# Patient Record
Sex: Male | Born: 1940 | Race: White | Hispanic: No | Marital: Married | Smoking: Former smoker
Health system: Southern US, Community
[De-identification: ages and names within clinical notes are randomized; demographics above are authoritative.]

## PROBLEM LIST (undated history)

## (undated) DIAGNOSIS — M1990 Unspecified osteoarthritis, unspecified site: Secondary | ICD-10-CM

## (undated) DIAGNOSIS — J449 Chronic obstructive pulmonary disease, unspecified: Secondary | ICD-10-CM

## (undated) DIAGNOSIS — Z87442 Personal history of urinary calculi: Secondary | ICD-10-CM

## (undated) HISTORY — PX: CATARACT EXTRACTION W/ INTRAOCULAR LENS IMPLANT: SHX1309

## (undated) HISTORY — PX: CORNEAL TRANSPLANT: SHX108

## (undated) HISTORY — PX: TONSILLECTOMY: SUR1361

---

## 2014-10-10 ENCOUNTER — Other Ambulatory Visit: Payer: Self-pay | Admitting: Orthopedic Surgery

## 2014-10-11 ENCOUNTER — Other Ambulatory Visit: Payer: Self-pay | Admitting: Orthopedic Surgery

## 2014-10-11 DIAGNOSIS — Z01818 Encounter for other preprocedural examination: Secondary | ICD-10-CM

## 2014-10-11 DIAGNOSIS — M25511 Pain in right shoulder: Secondary | ICD-10-CM

## 2014-10-15 ENCOUNTER — Ambulatory Visit
Admission: RE | Admit: 2014-10-15 | Discharge: 2014-10-15 | Disposition: A | Discharge status: Home / Self Care | Payer: Medicare Other | Source: Ambulatory Visit | Attending: Orthopedic Surgery | Admitting: Orthopedic Surgery

## 2014-10-15 DIAGNOSIS — M25511 Pain in right shoulder: Secondary | ICD-10-CM

## 2014-10-15 DIAGNOSIS — Z01818 Encounter for other preprocedural examination: Secondary | ICD-10-CM

## 2014-10-19 ENCOUNTER — Ambulatory Visit (HOSPITAL_COMMUNITY)
Admission: RE | Admit: 2014-10-19 | Discharge: 2014-10-19 | Disposition: A | Discharge status: Home / Self Care | Payer: Medicare Other | Source: Ambulatory Visit | Attending: Orthopedic Surgery | Admitting: Orthopedic Surgery

## 2014-10-19 ENCOUNTER — Encounter (HOSPITAL_COMMUNITY)
Admission: RE | Admit: 2014-10-19 | Discharge: 2014-10-19 | Disposition: A | Discharge status: Home / Self Care | Payer: Medicare Other | Source: Ambulatory Visit | Attending: Orthopedic Surgery | Admitting: Orthopedic Surgery

## 2014-10-19 ENCOUNTER — Encounter (HOSPITAL_COMMUNITY): Payer: Self-pay

## 2014-10-19 DIAGNOSIS — Z01818 Encounter for other preprocedural examination: Secondary | ICD-10-CM

## 2014-10-19 HISTORY — DX: Chronic obstructive pulmonary disease, unspecified: J44.9

## 2014-10-19 HISTORY — DX: Personal history of urinary calculi: Z87.442

## 2014-10-19 HISTORY — DX: Unspecified osteoarthritis, unspecified site: M19.90

## 2014-10-19 LAB — CBC WITH DIFFERENTIAL/PLATELET
BASOS ABS: 0.1 10*3/uL (ref 0.0–0.1)
BASOS PCT: 1 % (ref 0–1)
Eosinophils Absolute: 0.2 10*3/uL (ref 0.0–0.7)
Eosinophils Relative: 3 % (ref 0–5)
HCT: 41.3 % (ref 39.0–52.0)
Hemoglobin: 14.4 g/dL (ref 13.0–17.0)
LYMPHS ABS: 2.2 10*3/uL (ref 0.7–4.0)
Lymphocytes Relative: 30 % (ref 12–46)
MCH: 33.9 pg (ref 26.0–34.0)
MCHC: 34.9 g/dL (ref 30.0–36.0)
MCV: 97.2 fL (ref 78.0–100.0)
MONO ABS: 1 10*3/uL (ref 0.1–1.0)
MONOS PCT: 13 % — AB (ref 3–12)
Neutro Abs: 3.9 10*3/uL (ref 1.7–7.7)
Neutrophils Relative %: 53 % (ref 43–77)
Platelets: 231 10*3/uL (ref 150–400)
RBC: 4.25 MIL/uL (ref 4.22–5.81)
RDW: 12.7 % (ref 11.5–15.5)
WBC: 7.4 10*3/uL (ref 4.0–10.5)

## 2014-10-19 LAB — COMPREHENSIVE METABOLIC PANEL
ALBUMIN: 4.4 g/dL (ref 3.5–5.2)
ALT: 17 U/L (ref 0–53)
ANION GAP: 10 (ref 5–15)
AST: 29 U/L (ref 0–37)
Alkaline Phosphatase: 63 U/L (ref 39–117)
BILIRUBIN TOTAL: 0.6 mg/dL (ref 0.3–1.2)
BUN: 19 mg/dL (ref 6–23)
CO2: 27 mmol/L (ref 19–32)
Calcium: 9.5 mg/dL (ref 8.4–10.5)
Chloride: 103 mEq/L (ref 96–112)
Creatinine, Ser: 0.99 mg/dL (ref 0.50–1.35)
GFR calc Af Amer: >90 mL/min (ref >90)
GFR, EST NON AFRICAN AMERICAN: 78 mL/min — AB (ref >90)
Glucose, Bld: 127 mg/dL — ABNORMAL HIGH (ref 70–99)
Potassium: 4 mmol/L (ref 3.5–5.1)
Sodium: 140 mmol/L (ref 135–145)
Total Protein: 7.1 g/dL (ref 6.0–8.3)

## 2014-10-19 LAB — URINALYSIS, ROUTINE W REFLEX MICROSCOPIC
BILIRUBIN URINE: NEGATIVE
Glucose, UA: NEGATIVE mg/dL
HGB URINE DIPSTICK: NEGATIVE
Ketones, ur: NEGATIVE mg/dL
LEUKOCYTES UA: NEGATIVE
Nitrite: NEGATIVE
PH: 5.5 (ref 5.0–8.0)
Protein, ur: NEGATIVE mg/dL
SPECIFIC GRAVITY, URINE: 1.024 (ref 1.005–1.030)
Urobilinogen, UA: 0.2 mg/dL (ref 0.0–1.0)

## 2014-10-19 LAB — SURGICAL PCR SCREEN
MRSA, PCR: NEGATIVE
Staphylococcus aureus: NEGATIVE

## 2014-10-19 LAB — TYPE AND SCREEN
ABO/RH(D): O POS
ANTIBODY SCREEN: NEGATIVE

## 2014-10-19 LAB — ABO/RH: ABO/RH(D): O POS

## 2014-10-19 LAB — APTT: aPTT: 31 seconds (ref 24–37)

## 2014-10-19 LAB — PROTIME-INR
INR: 1.09 (ref 0.00–1.49)
Prothrombin Time: 14.3 seconds (ref 11.6–15.2)

## 2014-10-19 NOTE — Pre-Procedure Instructions (Signed)
Ferne CoeDavid Tuckey  10/19/2014   Your procedure is scheduled on:  Thursday  10/25/14  [facility] Admitting at 820 AM.  Call this number if you have problems the morning of surgery: [phone]   Remember:   Do not eat food or drink liquids after midnight.   Take these medicines the morning of surgery with A SIP OF WATER:  EYE DROPS (STOP ASPIRIN, IBUPROFEN, ADVIL, MOTRIN, FISH OIL, MULTIVITAMIN 7 DAYS PRIOR TO SURGERY)   Do not wear jewelry, make-up or nail polish.  Do not wear lotions, powders, or perfumes. You may wear deodorant.  Do not shave 48 hours prior to surgery. Men may shave face and neck.  Do not bring valuables to the hospital.  [facility] is not responsible                  for any belongings or valuables.               Contacts, dentures or bridgework may not be worn into surgery.  Leave suitcase in the car. After surgery it may be brought to your room.  For patients admitted to the hospital, discharge time is determined by your                treatment team.               Patients discharged the day of surgery will not be allowed to drive  home.  Name and phone number of your driver:   Special Instructions: Shower using CHG 2 nights before surgery and the night before surgery.  If you shower the day of surgery use CHG.  Use special wash - you have one bottle of CHG for all showers.  You should use approximately 1/3 of the bottle for each shower.   Please read over the following fact sheets that you were given: Pain Booklet, Coughing and Deep Breathing, MRSA Information and Surgical Site Infection Prevention

## 2014-10-24 MED ORDER — CEFAZOLIN SODIUM-DEXTROSE 2-3 GM-% IV SOLR
2.0000 g | INTRAVENOUS | Status: AC
  Administered 2014-10-25: 2 g via INTRAVENOUS
  Filled 2014-10-24: qty 50

## 2014-10-25 ENCOUNTER — Encounter (HOSPITAL_COMMUNITY): Payer: Self-pay | Admitting: Anesthesiology

## 2014-10-25 ENCOUNTER — Inpatient Hospital Stay (HOSPITAL_COMMUNITY): Payer: Medicare Other

## 2014-10-25 ENCOUNTER — Inpatient Hospital Stay (HOSPITAL_COMMUNITY): Payer: Medicare Other | Admitting: Anesthesiology

## 2014-10-25 ENCOUNTER — Inpatient Hospital Stay (HOSPITAL_COMMUNITY)
Admission: RE | Admit: 2014-10-25 | Discharge: 2014-10-26 | DRG: 483 | Disposition: A | Discharge status: Home / Self Care | Payer: Medicare Other | Source: Ambulatory Visit | Attending: Orthopedic Surgery | Admitting: Orthopedic Surgery

## 2014-10-25 ENCOUNTER — Encounter (HOSPITAL_COMMUNITY)
Admission: RE | Disposition: A | Discharge status: Home / Self Care | Payer: Self-pay | Source: Ambulatory Visit | Attending: Orthopedic Surgery

## 2014-10-25 DIAGNOSIS — M19011 Primary osteoarthritis, right shoulder: Secondary | ICD-10-CM | POA: Diagnosis present

## 2014-10-25 DIAGNOSIS — Z87891 Personal history of nicotine dependence: Secondary | ICD-10-CM | POA: Diagnosis not present

## 2014-10-25 DIAGNOSIS — J449 Chronic obstructive pulmonary disease, unspecified: Secondary | ICD-10-CM | POA: Diagnosis present

## 2014-10-25 DIAGNOSIS — Z79899 Other long term (current) drug therapy: Secondary | ICD-10-CM

## 2014-10-25 DIAGNOSIS — Z7982 Long term (current) use of aspirin: Secondary | ICD-10-CM

## 2014-10-25 DIAGNOSIS — M25511 Pain in right shoulder: Secondary | ICD-10-CM | POA: Diagnosis present

## 2014-10-25 DIAGNOSIS — Z947 Corneal transplant status: Secondary | ICD-10-CM

## 2014-10-25 DIAGNOSIS — Z96611 Presence of right artificial shoulder joint: Secondary | ICD-10-CM

## 2014-10-25 DIAGNOSIS — Z96619 Presence of unspecified artificial shoulder joint: Secondary | ICD-10-CM

## 2014-10-25 HISTORY — PX: TOTAL SHOULDER ARTHROPLASTY: SHX126

## 2014-10-25 SURGERY — ARTHROPLASTY, SHOULDER, TOTAL
Anesthesia: General | Laterality: Right

## 2014-10-25 MED ORDER — ONDANSETRON HCL 4 MG/2ML IJ SOLN
INTRAMUSCULAR | Status: DC | PRN
  Administered 2014-10-25: 4 mg via INTRAVENOUS

## 2014-10-25 MED ORDER — ACETAMINOPHEN 325 MG PO TABS: 650.0000 mg | ORAL_TABLET | Freq: Four times a day (QID) | ORAL | Status: DC | PRN

## 2014-10-25 MED ORDER — FLEET ENEMA 7-19 GM/118ML RE ENEM: 1.0000 | ENEMA | Freq: Once | RECTAL | Status: AC | PRN

## 2014-10-25 MED ORDER — KETOROLAC TROMETHAMINE 30 MG/ML IJ SOLN
30.0000 mg | Freq: Once | INTRAMUSCULAR | Status: AC | PRN
  Administered 2014-10-25: 30 mg via INTRAVENOUS

## 2014-10-25 MED ORDER — PHENYLEPHRINE HCL 10 MG/ML IJ SOLN
INTRAMUSCULAR | Status: DC | PRN
  Administered 2014-10-25: 40 ug via INTRAVENOUS
  Administered 2014-10-25: 80 ug via INTRAVENOUS

## 2014-10-25 MED ORDER — OXYCODONE HCL 5 MG PO TABS
5.0000 mg | ORAL_TABLET | ORAL | Status: DC | PRN
  Administered 2014-10-26: 10 mg via ORAL
  Filled 2014-10-25: qty 2

## 2014-10-25 MED ORDER — PROPOFOL 10 MG/ML IV BOLUS
INTRAVENOUS | Status: DC | PRN
  Administered 2014-10-25: 150 mg via INTRAVENOUS
  Administered 2014-10-25: 50 mg via INTRAVENOUS

## 2014-10-25 MED ORDER — 0.9 % SODIUM CHLORIDE (POUR BTL) OPTIME
TOPICAL | Status: DC | PRN
  Administered 2014-10-25: 1000 mL

## 2014-10-25 MED ORDER — ROCURONIUM BROMIDE 50 MG/5ML IV SOLN
INTRAVENOUS | Status: AC
  Filled 2014-10-25: qty 1

## 2014-10-25 MED ORDER — KETOROLAC TROMETHAMINE 30 MG/ML IJ SOLN
INTRAMUSCULAR | Status: AC
  Administered 2014-10-25: 30 mg via INTRAVENOUS
  Filled 2014-10-25: qty 1

## 2014-10-25 MED ORDER — FENTANYL CITRATE 0.05 MG/ML IJ SOLN
INTRAMUSCULAR | Status: AC
  Administered 2014-10-25: 50 ug
  Filled 2014-10-25: qty 2

## 2014-10-25 MED ORDER — SIMVASTATIN 40 MG PO TABS
40.0000 mg | ORAL_TABLET | Freq: Every day | ORAL | Status: DC
  Administered 2014-10-25: 40 mg via ORAL
  Filled 2014-10-25 (×2): qty 1

## 2014-10-25 MED ORDER — FENTANYL CITRATE 0.05 MG/ML IJ SOLN
INTRAMUSCULAR | Status: AC
  Filled 2014-10-25: qty 5

## 2014-10-25 MED ORDER — PHENYLEPHRINE HCL 10 MG/ML IJ SOLN
10.0000 mg | INTRAVENOUS | Status: DC | PRN
  Administered 2014-10-25: 25 ug/min via INTRAVENOUS

## 2014-10-25 MED ORDER — ROCURONIUM BROMIDE 100 MG/10ML IV SOLN
INTRAVENOUS | Status: DC | PRN
  Administered 2014-10-25: 50 mg via INTRAVENOUS

## 2014-10-25 MED ORDER — ALUMINUM HYDROXIDE GEL 320 MG/5ML PO SUSP
15.0000 mL | ORAL | Status: DC | PRN
  Filled 2014-10-25: qty 30

## 2014-10-25 MED ORDER — PROMETHAZINE HCL 25 MG/ML IJ SOLN: 6.2500 mg | INTRAMUSCULAR | Status: DC | PRN

## 2014-10-25 MED ORDER — OXYCODONE-ACETAMINOPHEN 5-325 MG PO TABS
ORAL_TABLET | ORAL | Status: AC
  Administered 2014-10-25: 2 via ORAL
  Filled 2014-10-25: qty 2

## 2014-10-25 MED ORDER — MONTELUKAST SODIUM 10 MG PO TABS
10.0000 mg | ORAL_TABLET | Freq: Every day | ORAL | Status: DC
  Administered 2014-10-25: 10 mg via ORAL
  Filled 2014-10-25 (×2): qty 1

## 2014-10-25 MED ORDER — MORPHINE SULFATE 2 MG/ML IJ SOLN: 1.0000 mg | INTRAMUSCULAR | Status: DC | PRN

## 2014-10-25 MED ORDER — ONDANSETRON HCL 4 MG/2ML IJ SOLN: 4.0000 mg | Freq: Four times a day (QID) | INTRAMUSCULAR | Status: DC | PRN

## 2014-10-25 MED ORDER — ACETAMINOPHEN 650 MG RE SUPP: 650.0000 mg | Freq: Four times a day (QID) | RECTAL | Status: DC | PRN

## 2014-10-25 MED ORDER — GLYCOPYRROLATE 0.2 MG/ML IJ SOLN
INTRAMUSCULAR | Status: DC | PRN
  Administered 2014-10-25: 0.4 mg via INTRAVENOUS
  Administered 2014-10-25: 0.2 mg via INTRAVENOUS

## 2014-10-25 MED ORDER — FENTANYL CITRATE 0.05 MG/ML IJ SOLN
INTRAMUSCULAR | Status: DC | PRN
  Administered 2014-10-25 (×5): 50 ug via INTRAVENOUS

## 2014-10-25 MED ORDER — ASPIRIN EC 325 MG PO TBEC
325.0000 mg | DELAYED_RELEASE_TABLET | Freq: Two times a day (BID) | ORAL | Status: DC
  Administered 2014-10-25 – 2014-10-26 (×2): 325 mg via ORAL
  Filled 2014-10-25 (×4): qty 1

## 2014-10-25 MED ORDER — METOCLOPRAMIDE HCL 5 MG PO TABS
5.0000 mg | ORAL_TABLET | Freq: Three times a day (TID) | ORAL | Status: DC | PRN
  Filled 2014-10-25: qty 2

## 2014-10-25 MED ORDER — ONDANSETRON HCL 4 MG PO TABS: 4.0000 mg | ORAL_TABLET | Freq: Four times a day (QID) | ORAL | Status: DC | PRN

## 2014-10-25 MED ORDER — PHENOL 1.4 % MT LIQD: 1.0000 | OROMUCOSAL | Status: DC | PRN

## 2014-10-25 MED ORDER — ZOLPIDEM TARTRATE 5 MG PO TABS: 5.0000 mg | ORAL_TABLET | Freq: Every evening | ORAL | Status: DC | PRN

## 2014-10-25 MED ORDER — POVIDONE-IODINE 7.5 % EX SOLN: Freq: Once | CUTANEOUS | Status: DC

## 2014-10-25 MED ORDER — POLYETHYLENE GLYCOL 3350 17 G PO PACK: 17.0000 g | PACK | Freq: Every day | ORAL | Status: DC | PRN

## 2014-10-25 MED ORDER — SODIUM CHLORIDE 0.9 % IR SOLN
Status: DC | PRN
  Administered 2014-10-25: 3000 mL

## 2014-10-25 MED ORDER — BISACODYL 10 MG RE SUPP: 10.0000 mg | Freq: Every day | RECTAL | Status: DC | PRN

## 2014-10-25 MED ORDER — DIFLUPREDNATE 0.05 % OP EMUL
1.0000 [drp] | Freq: Two times a day (BID) | OPHTHALMIC | Status: DC
  Administered 2014-10-25 – 2014-10-26 (×3): 1 [drp] via OPHTHALMIC

## 2014-10-25 MED ORDER — DOCUSATE SODIUM 100 MG PO CAPS: 100.0000 mg | ORAL_CAPSULE | Freq: Three times a day (TID) | ORAL | Status: DC | PRN

## 2014-10-25 MED ORDER — OXYCODONE-ACETAMINOPHEN 5-325 MG PO TABS
1.0000 | ORAL_TABLET | ORAL | Status: DC | PRN
  Administered 2014-10-25 – 2014-10-26 (×4): 2 via ORAL
  Filled 2014-10-25 (×3): qty 2

## 2014-10-25 MED ORDER — NEOSTIGMINE METHYLSULFATE 10 MG/10ML IV SOLN
INTRAVENOUS | Status: DC | PRN
  Administered 2014-10-25: 3 mg via INTRAVENOUS

## 2014-10-25 MED ORDER — LACTATED RINGERS IV SOLN
INTRAVENOUS | Status: DC
  Administered 2014-10-25: 50 mL/h via INTRAVENOUS

## 2014-10-25 MED ORDER — HYDROMORPHONE HCL 1 MG/ML IJ SOLN: 0.2500 mg | INTRAMUSCULAR | Status: DC | PRN

## 2014-10-25 MED ORDER — LIDOCAINE HCL (CARDIAC) 20 MG/ML IV SOLN
INTRAVENOUS | Status: DC | PRN
  Administered 2014-10-25: 80 mg via INTRAVENOUS

## 2014-10-25 MED ORDER — ARTIFICIAL TEARS OP OINT
TOPICAL_OINTMENT | OPHTHALMIC | Status: DC | PRN
  Administered 2014-10-25: 1 via OPHTHALMIC

## 2014-10-25 MED ORDER — MIDAZOLAM HCL 2 MG/2ML IJ SOLN
INTRAMUSCULAR | Status: AC
  Administered 2014-10-25: 1 mg
  Filled 2014-10-25: qty 2

## 2014-10-25 MED ORDER — ONDANSETRON HCL 4 MG/2ML IJ SOLN
INTRAMUSCULAR | Status: AC
  Filled 2014-10-25: qty 2

## 2014-10-25 MED ORDER — METOCLOPRAMIDE HCL 5 MG/ML IJ SOLN
5.0000 mg | Freq: Three times a day (TID) | INTRAMUSCULAR | Status: DC | PRN
Start: 2014-10-25 — End: 2014-10-26

## 2014-10-25 MED ORDER — NEOSTIGMINE METHYLSULFATE 10 MG/10ML IV SOLN
INTRAVENOUS | Status: AC
  Filled 2014-10-25: qty 1

## 2014-10-25 MED ORDER — PROPOFOL 10 MG/ML IV BOLUS
INTRAVENOUS | Status: AC
  Filled 2014-10-25: qty 20

## 2014-10-25 MED ORDER — MENTHOL 3 MG MT LOZG
1.0000 | LOZENGE | OROMUCOSAL | Status: DC | PRN
  Filled 2014-10-25: qty 9

## 2014-10-25 MED ORDER — CEFAZOLIN SODIUM-DEXTROSE 2-3 GM-% IV SOLR
2.0000 g | Freq: Four times a day (QID) | INTRAVENOUS | Status: DC
  Administered 2014-10-25 – 2014-10-26 (×2): 2 g via INTRAVENOUS
  Filled 2014-10-25 (×3): qty 50

## 2014-10-25 MED ORDER — GLYCOPYRROLATE 0.2 MG/ML IJ SOLN
INTRAMUSCULAR | Status: AC
  Filled 2014-10-25: qty 3

## 2014-10-25 MED ORDER — DOCUSATE SODIUM 100 MG PO CAPS
100.0000 mg | ORAL_CAPSULE | Freq: Two times a day (BID) | ORAL | Status: DC
  Administered 2014-10-25 – 2014-10-26 (×2): 100 mg via ORAL
  Filled 2014-10-25 (×4): qty 1

## 2014-10-25 MED ORDER — OXYCODONE-ACETAMINOPHEN 5-325 MG PO TABS: 1.0000 | ORAL_TABLET | ORAL | Status: DC | PRN

## 2014-10-25 MED ORDER — SODIUM CHLORIDE 0.9 % IV SOLN
INTRAVENOUS | Status: DC
  Administered 2014-10-26: 04:00:00 via INTRAVENOUS

## 2014-10-25 MED ORDER — LIDOCAINE HCL (CARDIAC) 20 MG/ML IV SOLN
INTRAVENOUS | Status: AC
  Filled 2014-10-25: qty 5

## 2014-10-25 MED ORDER — DIPHENHYDRAMINE HCL 12.5 MG/5ML PO ELIX: 12.5000 mg | ORAL_SOLUTION | ORAL | Status: DC | PRN

## 2014-10-25 MED ORDER — LACTATED RINGERS IV SOLN
INTRAVENOUS | Status: DC | PRN
  Administered 2014-10-25 (×2): via INTRAVENOUS

## 2014-10-25 SURGICAL SUPPLY — 69 items
BIT DRILL 5/64X5 DISP (BIT) IMPLANT
BLADE SAW SAG 73X25 THK (BLADE) ×2
BLADE SAW SGTL 73X25 THK (BLADE) ×1 IMPLANT
BLADE SURG 15 STRL LF DISP TIS (BLADE) ×1 IMPLANT
BLADE SURG 15 STRL SS (BLADE) ×2
CAP SHOULDER TOTAL 2 ×3 IMPLANT
CEMENT BONE DEPUY (Cement) ×3 IMPLANT
CHLORAPREP W/TINT 26ML (MISCELLANEOUS) ×3 IMPLANT
CLOSURE STERI-STRIP 1/2X4 (GAUZE/BANDAGES/DRESSINGS) ×1
CLOSURE WOUND 1/2 X4 (GAUZE/BANDAGES/DRESSINGS) ×1
CLSR STERI-STRIP ANTIMIC 1/2X4 (GAUZE/BANDAGES/DRESSINGS) ×2 IMPLANT
COVER MAYO STAND STRL (DRAPES) ×3 IMPLANT
COVER SURGICAL LIGHT HANDLE (MISCELLANEOUS) ×3 IMPLANT
DRAPE IMP U-DRAPE 54X76 (DRAPES) ×3 IMPLANT
DRAPE INCISE IOBAN 66X45 STRL (DRAPES) ×6 IMPLANT
DRAPE ORTHO SPLIT 77X108 STRL (DRAPES) ×4
DRAPE SURG 17X23 STRL (DRAPES) ×3 IMPLANT
DRAPE SURG ORHT 6 SPLT 77X108 (DRAPES) ×2 IMPLANT
DRAPE U-SHAPE 47X51 STRL (DRAPES) ×3 IMPLANT
DRSG AQUACEL AG ADV 3.5X10 (GAUZE/BANDAGES/DRESSINGS) ×3 IMPLANT
ELECT BLADE 4.0 EZ CLEAN MEGAD (MISCELLANEOUS)
ELECT REM PT RETURN 9FT ADLT (ELECTROSURGICAL) ×3
ELECTRODE BLDE 4.0 EZ CLN MEGD (MISCELLANEOUS) IMPLANT
ELECTRODE REM PT RTRN 9FT ADLT (ELECTROSURGICAL) ×1 IMPLANT
EVACUATOR 1/8 PVC DRAIN (DRAIN) IMPLANT
GLOVE BIO SURGEON STRL SZ7 (GLOVE) ×3 IMPLANT
GLOVE BIO SURGEON STRL SZ7.5 (GLOVE) ×3 IMPLANT
GLOVE BIOGEL PI IND STRL 7.0 (GLOVE) ×1 IMPLANT
GLOVE BIOGEL PI IND STRL 8 (GLOVE) ×1 IMPLANT
GLOVE BIOGEL PI INDICATOR 7.0 (GLOVE) ×2
GLOVE BIOGEL PI INDICATOR 8 (GLOVE) ×2
GOWN STRL REUS W/ TWL LRG LVL3 (GOWN DISPOSABLE) ×3 IMPLANT
GOWN STRL REUS W/ TWL XL LVL3 (GOWN DISPOSABLE) ×2 IMPLANT
GOWN STRL REUS W/TWL LRG LVL3 (GOWN DISPOSABLE) ×6
GOWN STRL REUS W/TWL XL LVL3 (GOWN DISPOSABLE) ×4
HANDPIECE INTERPULSE COAX TIP (DISPOSABLE) ×2
HEMOSTAT SURGICEL 2X14 (HEMOSTASIS) ×3 IMPLANT
HOOD PEEL AWAY FACE SHEILD DIS (HOOD) ×9 IMPLANT
KIT BASIN OR (CUSTOM PROCEDURE TRAY) ×3 IMPLANT
KIT ROOM TURNOVER OR (KITS) ×3 IMPLANT
MANIFOLD NEPTUNE II (INSTRUMENTS) ×3 IMPLANT
NEEDLE HYPO 25GX1X1/2 BEV (NEEDLE) IMPLANT
NEEDLE MAYO TROCAR (NEEDLE) ×3 IMPLANT
NS IRRIG 1000ML POUR BTL (IV SOLUTION) ×3 IMPLANT
PACK SHOULDER (CUSTOM PROCEDURE TRAY) ×3 IMPLANT
PAD ARMBOARD 7.5X6 YLW CONV (MISCELLANEOUS) ×6 IMPLANT
PIN GUIDE 2.5X200 (PIN) IMPLANT
RETRIEVER SUT HEWSON (MISCELLANEOUS) ×3 IMPLANT
SET HNDPC FAN SPRY TIP SCT (DISPOSABLE) ×1 IMPLANT
SLING ARM IMMOBILIZER LRG (SOFTGOODS) ×3 IMPLANT
SLING ARM IMMOBILIZER MED (SOFTGOODS) IMPLANT
SMARTMIX MINI TOWER (MISCELLANEOUS) ×3
SPONGE LAP 18X18 X RAY DECT (DISPOSABLE) ×3 IMPLANT
SPONGE LAP 4X18 X RAY DECT (DISPOSABLE) ×3 IMPLANT
STRIP CLOSURE SKIN 1/2X4 (GAUZE/BANDAGES/DRESSINGS) ×2 IMPLANT
SUCTION FRAZIER TIP 10 FR DISP (SUCTIONS) ×3 IMPLANT
SUPPORT WRAP ARM LG (MISCELLANEOUS) ×3 IMPLANT
SUT ETHIBOND NAB CT1 #1 30IN (SUTURE) ×9 IMPLANT
SUT MNCRL AB 4-0 PS2 18 (SUTURE) ×3 IMPLANT
SUT SILK 2 0 TIES 17X18 (SUTURE)
SUT SILK 2-0 18XBRD TIE BLK (SUTURE) IMPLANT
SUT VIC AB 0 CTB1 27 (SUTURE) ×3 IMPLANT
SUT VIC AB 2-0 CT1 27 (SUTURE) ×4
SUT VIC AB 2-0 CT1 TAPERPNT 27 (SUTURE) ×2 IMPLANT
SYR CONTROL 10ML LL (SYRINGE) IMPLANT
TAPE FIBER 2MM 7IN #2 BLUE (SUTURE) ×9 IMPLANT
TOWEL OR 17X24 6PK STRL BLUE (TOWEL DISPOSABLE) ×3 IMPLANT
TOWEL OR 17X26 10 PK STRL BLUE (TOWEL DISPOSABLE) ×3 IMPLANT
TOWER SMARTMIX MINI (MISCELLANEOUS) ×1 IMPLANT

## 2014-10-25 NOTE — Discharge Instructions (Signed)
Discharge Instructions after Reverse Total Shoulder Arthroplasty ° ° °A sling has been provided for you. You are to where this at all times, even while sleeping, until your first post operative visit with Dr. Chandler. °Use ice on the shoulder intermittently over the first 48 hours after surgery.  °Pain medicine has been prescribed for you.  °Use your medicine liberally over the first 48 hours, and then you can begin to taper your use. You may take Extra Strength Tylenol or Tylenol only in place of the pain pills. DO NOT take ANY nonsteroidal anti-inflammatory pain medications: Advil, Motrin, Ibuprofen, Aleve, Naproxen or Naprosyn.  °Take one aspirin a day for 2 weeks after surgery, unless you have an aspirin sensitivity/allergy or asthma.  °You may remove your dressing after two days  °You may shower 5 days after surgery. The incisions CANNOT get wet prior to 5 days. Simply allow the water to wash over the site and then pat dry. Do not rub the incisions. Make sure your axilla (armpit) is completely dry after showering. ° ° ° °Please call [phone] during normal business hours or [phone] after hours for any problems. Including the following: ° °- excessive redness of the incisions °- drainage for more than 4 days °- fever of more than 101.5 F ° °*Please note that pain medications will not be refilled after hours or on weekends. ° ° ° ° °

## 2014-10-25 NOTE — H&P (Signed)
Eric Brewer is an 74 y.o. male.   Chief Complaint: R shoulder pain and dysfunction HPI: 74 year old male with severe right shoulder pain and dysfunction with endstage osteoarthritis with posterior wear. He failed conservative management with activity modification, NSAIDs, injection therapy, and wished before the surgery to improve his quality of life.  Past Medical History  Diagnosis Date  . COPD (chronic obstructive pulmonary disease)     MINIMAL   . Arthritis   . History of kidney stones     Past Surgical History  Procedure Laterality Date  . Cataract extraction w/ intraocular lens implant      RIGHT  . 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 Tonsillectomy      History reviewed. No pertinent family history. Social History:  reports that he has quit smoking. He does not have any smokeless tobacco history on file. He reports that he drinks alcohol. He reports that he does not use illicit drugs.  Allergies: No Known Allergies  Medications Prior to Admission  Medication Sig Dispense Refill  . aspirin 325 MG tablet Take 650 mg by mouth daily as needed for mild pain or moderate pain.    . DUREZOL 0.05 % EMUL Place 1 drop into the right eye 2 (two) times daily.  2  . ibuprofen (ADVIL,MOTRIN) 200 MG tablet Take 400 mg by mouth every 8 (eight) hours as needed.    . montelukast (SINGULAIR) 10 MG tablet Take 1 tablet by mouth at bedtime.  3  . Multiple Vitamins-Minerals (MULTIVITAMIN WITH MINERALS) tablet Take 1 tablet by mouth daily.    . Omega-3 Fatty Acids (FISH OIL PO) Take 1 capsule by mouth daily.    . simvastatin (ZOCOR) 40 MG tablet Take 1 tablet by mouth daily.  1    No results found for this or any previous visit (from the past 48 hour(s)). No results found.  Review of Systems  All other systems reviewed and are negative.   Blood pressure 195/95, pulse 60, temperature 98.5 F (36.9 C), resp. rate 16, SpO2 97 %. Physical Exam  Constitutional: He is oriented to  person, place, and time. He appears well-developed and well-nourished.  HENT:  Head: Atraumatic.  Eyes: EOM are normal.  Cardiovascular: Intact distal pulses.   Respiratory: Effort normal.  Musculoskeletal:  Pain with limited R shoulder ROM.  Neurological: He is alert and oriented to person, place, and time.  Skin: Skin is warm and dry.  Psychiatric: He has a normal mood and affect.     Assessment/Plan Right shoulder endstage primary osteoarthritis Plan right total shoulder replacement Risks / benefits of surgery discussed Consent on chart  NPO for OR Preop antibiotics   [place] WILLIAM 10/25/2014, 9:22 AM

## 2014-10-25 NOTE — Transfer of Care (Signed)
Immediate Anesthesia Transfer of Care Note  Patient: Eric Brewer  Procedure(s) Performed: Procedure(s) with comments: TOTAL SHOULDER ARTHROPLASTY (Right) - Right total shoulder arthroplasty  Patient Location: PACU  Anesthesia Type:General  Level of Consciousness: awake, alert  and oriented  Airway & Oxygen Therapy: Patient Spontanous Breathing and Patient connected to nasal cannula oxygen  Post-op Assessment: Report given to PACU RN and Post -op Vital signs reviewed and stable  Post vital signs: Reviewed and stable  Complications: No apparent anesthesia complications

## 2014-10-25 NOTE — Anesthesia Procedure Notes (Addendum)
Anesthesia Regional Block:  Interscalene brachial plexus block  Pre-Anesthetic Checklist: ,, timeout performed, Correct Patient, Correct Site, Correct Laterality, Correct Procedure, Correct Position, site marked, Risks and benefits discussed,  Surgical consent,  Pre-op evaluation,  At surgeon's request and post-op pain management  Laterality: Right  Prep: chloraprep       Needles:  Injection technique: Single-shot  Needle Type: Echogenic Stimulator Needle     Needle Length: 9cm 9 cm Needle Gauge: 21 and 21 G    Additional Needles:  Procedures: ultrasound guided (picture in chart) Interscalene brachial plexus block Narrative:  Start time: 10/25/2014 9:35 AM End time: 10/25/2014 9:46 AM Injection made incrementally with aspirations every 5 mL.  Performed by: Personally  Anesthesiologist: ROSE, Greggory StallionGEORGE  Additional Notes: Patient tolerated the procedure well without complications   Procedure Name: Intubation Date/Time: 10/25/2014 10:06 AM Performed by: Leonel Ramsay'LAUGHLIN, Johannes Everage H Pre-anesthesia Checklist: Patient identified, Patient being monitored, Emergency Drugs available, Timeout performed and Suction available Patient Re-evaluated:Patient Re-evaluated prior to inductionOxygen Delivery Method: Circle system utilized Preoxygenation: Pre-oxygenation with 100% oxygen Intubation Type: IV induction Ventilation: Mask ventilation without difficulty and Oral airway inserted - appropriate to patient size Laryngoscope Size: Mac and 3 Grade View: Grade I Tube type: Oral Tube size: 7.5 mm Number of attempts: 1 Airway Equipment and Method: Stylet and Oral airway Placement Confirmation: ETT inserted through vocal cords under direct vision,  positive ETCO2 and breath sounds checked- equal and bilateral Secured at: 22 cm Tube secured with: Tape Dental Injury: Teeth and Oropharynx as per pre-operative assessment

## 2014-10-25 NOTE — Anesthesia Preprocedure Evaluation (Signed)
Anesthesia Evaluation  Patient identified by MRN, date of birth, ID band Patient awake    Reviewed: Allergy & Precautions, NPO status , Patient's Chart, lab work & pertinent test results  Airway Mallampati: II  TM Distance: >3 FB Neck ROM: Full    Dental no notable dental hx.    Pulmonary COPDformer smoker,  breath sounds clear to auscultation  Pulmonary exam normal       Cardiovascular negative cardio ROS  Rhythm:Regular Rate:Normal     Neuro/Psych negative neurological ROS  negative psych ROS   GI/Hepatic negative GI ROS, Neg liver ROS,   Endo/Other  negative endocrine ROS  Renal/GU negative Renal ROS  negative genitourinary   Musculoskeletal negative musculoskeletal ROS (+)   Abdominal   Peds negative pediatric ROS (+)  Hematology negative hematology ROS (+)   Anesthesia Other Findings   Reproductive/Obstetrics negative OB ROS                             Anesthesia Physical Anesthesia Plan  ASA: II  Anesthesia Plan: General   Post-op Pain Management:    Induction: Intravenous  Airway Management Planned: Oral ETT  Additional Equipment:   Intra-op Plan:   Post-operative Plan: Extubation in OR  Informed Consent: I have reviewed the patients History and Physical, chart, labs and discussed the procedure including the risks, benefits and alternatives for the proposed anesthesia with the patient or authorized representative who has indicated his/her understanding and acceptance.   Dental advisory given  Plan Discussed with: CRNA and Surgeon  Anesthesia Plan Comments:         Anesthesia Quick Evaluation

## 2014-10-25 NOTE — Progress Notes (Signed)
Utilization review completed.  

## 2014-10-25 NOTE — Op Note (Signed)
Procedure(s): TOTAL SHOULDER ARTHROPLASTY Procedure Note  Eric Brewer male 74 y.o. 10/25/2014  Procedure(s) and Anesthesia Type:    * Right TOTAL SHOULDER ARTHROPLASTY - General  Surgeon(s) and Role:    * Mable Paris, MD - Primary   Indications:  74 y.o. male  With endstage right shoulder arthritis with B2 glenoid morphology. Pain and dysfunction interfered with quality of life and nonoperative treatment with activity modification, NSAIDS and injections failed.     Surgeon: Mable Paris   Assistants: Damita Lack PA-C [facility] was present and scrubbed throughout the procedure and was essential in positioning, retraction, exposure, and closure)  Anesthesia: General endotracheal anesthesia with preoperative interscalene block given by the attending anesthesiologist    Procedure Detail  TOTAL SHOULDER ARTHROPLASTY  Findings: Tornier flex anatomic press-fit size 6C stem with a 52 high eccentric head, cemented size 50 large Cortiloc glenoid.   A lesser tuberosity osteotomy was performed and repaired at the conclusion of the procedure.  Estimated Blood Loss:  300 mL         Drains: None   Blood Given: none          Specimens: none        Complications:  * No complications entered in OR log *         Disposition: PACU - hemodynamically stable.         Condition: stable    Procedure:   The patient was identified in the preoperative holding area where I personally marked the operative extremity after verifying with the patient and consent. He  was taken to the operating room where He was transferred to the   operative table.  The patient received an interscalene block in   the holding area by the attending anesthesiologist.  General anesthesia was induced   in the operating room without complication.  The patient did receive IV  Ancef prior to the commencement of the procedure.  The patient was   placed in the beach-chair position with the  back raised about 30   degrees.  The nonoperative extremity and head and neck were carefully   positioned and padded protecting against neurovascular compromise.  The   left upper extremity was then prepped and draped in the standard sterile   fashion.    The appropriate operative time-out was performed with   Anesthesia, the perioperative staff, as well as myself and we all agreed   that the right side was the correct operative site.  An approximately   10 cm incision was made from the tip of the coracoid to the center point of the   humerus at the level of the axilla.  Dissection was carried down sharply   through subcutaneous tissues and cephalic vein was identified and taken   laterally with the deltoid.  The pectoralis major was taken medially.  The   upper 1 cm of the pectoralis major was released from its attachment on   the humerus.  The clavipectoral fascia was incised just lateral to the   conjoined tendon.  This incision was carried up to but not into the   coracoacromial ligament.  Digital palpation was used to prove   integrity of the axillary nerve which was protected throughout the   procedure.  Musculocutaneous nerve was not palpated in the operative   field.  Conjoined tendon was then retracted gently medially and the   deltoid laterally.  Anterior circumflex humeral vessels were clamped and   coagulated.  The  soft tissues overlying the biceps was incised and this   incision was carried across the transverse humeral ligament to the base   of the coracoid.  The biceps was tenodesed to the soft tissue just above   pectoralis major and the remaining portion of the biceps superiorly was   excised.  An osteotomy was performed at the lesser tuberosity  and the   subscapularis was freed from the underlying capsule.  Capsule was then   released all the way down to the 6 o'clock position of the humeral head.   The humeral head was then delivered with simultaneous adduction,    extension and external rotation.  All humeral osteophytes were removed   and the anatomic neck of the humerus was marked and cut free hand at   approximately 25 degrees retroversion within about 3 mm of the cuff   reflection posteriorly.  The head size was estimated to be a 52 medium   offset.  At that point, the humeral head was retracted posteriorly with   a Fukuda retractor  and the anterior-inferior capsule was excised.   Remaining portion of the capsule was released at the base of the   coracoid.  The remaining biceps anchor and the entire anterior-inferior   labrum was excised.  The posterior labrum was also excised but the   posterior capsule was not released.  The guidepin was placed bicortically with a 10 elevated guide.  The reamer was used to ream to concentric bone with punctate bleeding.  This preferentially reamed the anterior half of the glenoid.  The trial was noted to have about 10-20% posterior uncovered, but this is felt to be acceptable given the level of correction. The trial was quite stable.  The center hole was then drilled for an anchor peg glenoid followed by the three peripheral holes and none of the holes   exited the glenoid wall.  I then pulse irrigated these holes and dried   them with Surgicel.  The three peripheral holes were then   pressurized cemented and the anchor peg glenoid was placed and impacted   with an excellent fit.  The glenoid was a 50 large component.  The proximal humerus was then again exposed taking care not to displace the glenoid.    The entry awl was used followed by sounding reamers and then sequentially broached from size 3 to 6. This was then left in place and the calcar planer was used. Trial head was placed with a 52 high eccentric head.  With the trial implantation of the component, there was approximately 50% posterior translation with immediate snap back to the   anatomic position.  With forward elevation, there was no tendency   towards  posterior subluxation.   The trial was removed and the final implant was prepared on a back table.  The trial was removed and the final implant was prepared on a back table.   Small holes were drilled on both sides of the lesser tuberosity osteotomy, through which 3 Fibertapes were passed. The implant was then placed through the loop of all 3 Fibertapes and impacted with an excellent press-fit. This achieved excellent anatomic reconstruction of the proximal humerus.  The joint was then copiously irrigated with pulse lavage.  The subscapularis and   lesser tuberosity osteotomy were then repaired using the 3 Fibertapes previously passed.   One #1 Ethibond was placed at the rotator interval just above   the lesser tuberosity. Copious irrigation was used. Skin  was closed with 2-0 Vicryl sutures in the deep dermal layer and 4-0 Monocryl in a subcuticular  running fashion.  Sterile dressings were then applied including Aquacel.  The patient was placed in a sling and allowed to awaken from general anesthesia and taken to the recovery room in stable condition.      POSTOPERATIVE PLAN:  Early passive range of motion will be allowed with the goal of 30 degrees external rotation and 130 degrees forward elevation.  No internal rotation at this time.  No active motion of the arm until the lesser tuberosity heals.  The patient will likely be kept in the hospital for 1-2 days and then discharged home.

## 2014-10-26 LAB — BASIC METABOLIC PANEL
ANION GAP: 9 (ref 5–15)
BUN: 14 mg/dL (ref 6–23)
CO2: 27 mmol/L (ref 19–32)
Calcium: 8.3 mg/dL — ABNORMAL LOW (ref 8.4–10.5)
Chloride: 100 mEq/L (ref 96–112)
Creatinine, Ser: 1.03 mg/dL (ref 0.50–1.35)
GFR calc non Af Amer: 69 mL/min — ABNORMAL LOW (ref >90)
GFR, EST AFRICAN AMERICAN: 80 mL/min — AB (ref >90)
Glucose, Bld: 163 mg/dL — ABNORMAL HIGH (ref 70–99)
POTASSIUM: 4.1 mmol/L (ref 3.5–5.1)
Sodium: 136 mmol/L (ref 135–145)

## 2014-10-26 LAB — CBC
HEMATOCRIT: 35.9 % — AB (ref 39.0–52.0)
Hemoglobin: 12.3 g/dL — ABNORMAL LOW (ref 13.0–17.0)
MCH: 33.8 pg (ref 26.0–34.0)
MCHC: 34.3 g/dL (ref 30.0–36.0)
MCV: 98.6 fL (ref 78.0–100.0)
Platelets: 193 10*3/uL (ref 150–400)
RBC: 3.64 MIL/uL — ABNORMAL LOW (ref 4.22–5.81)
RDW: 12.5 % (ref 11.5–15.5)
WBC: 9.3 10*3/uL (ref 4.0–10.5)

## 2014-10-26 NOTE — Evaluation (Signed)
Occupational Therapy Evaluation and Discharge Patient Details Name: Eric Brewer MRN: [id number] DOB: Aug 16, 1941 Today's Date: 10/26/2014    History of Present Illness Pt is a 74 y.o. male s/p R TSA.     Clinical Impression   PTA pt lived at home and was independent with ADLs. He is very active and enjoys exercising. Pt is currently limited by decreased R shoulder ROM which impairs his independence with ADLs. Pt and wife educated on shoulder precautions, compensatory techniques, and safety at home with ADLs. Wife returned demonstration to assist with pt.     Follow Up Recommendations  No OT follow up;Supervision/Assistance - 24 hour    Equipment Recommendations  None recommended by OT    Recommendations for Other Services       Precautions / Restrictions Precautions Precautions: Shoulder Type of Shoulder Precautions: AAROM FF to 130*, ER to 30* per [employer]. Elbow, wrist, and hand.  Shoulder Interventions: Shoulder sling/immobilizer Precaution Booklet Issued: Yes (comment) Precaution Comments: Educated pt and wife on shoulder precautions and incorporating into ADLs.  Required Braces or Orthoses: Sling Restrictions Weight Bearing Restrictions: Yes RUE Weight Bearing: Non weight bearing      Mobility Bed Mobility               General bed mobility comments: Pt up in room when OT arrived.   Transfers Overall transfer level: Modified independent Equipment used: None                       ADL Overall ADL's : Needs assistance/impaired Eating/Feeding: Set up;Sitting   Grooming: Set up;Standing   Upper Body Bathing: Minimal assitance;Sitting   Lower Body Bathing: Minimal assistance;Sit to/from stand   Upper Body Dressing : Moderate assistance;Sitting   Lower Body Dressing: Minimal assistance;Sit to/from stand   Toilet Transfer: Supervision/safety;Ambulation   Toileting- Clothing Manipulation and Hygiene: Supervision/safety;Sit to/from stand        Functional mobility during ADLs: Supervision/safety General ADL Comments: Pt reports no pain and participated in ADL training. Also participated in shoulder exercises.     Vision  Pt reports no change from baseline.                    Perception Perception Perception Tested?: No   Praxis Praxis Praxis tested?: Within functional limits    Pertinent Vitals/Pain Pain Assessment: No/denies pain     Hand Dominance Right   Extremity/Trunk Assessment Upper Extremity Assessment Upper Extremity Assessment: RUE deficits/detail RUE Deficits / Details: R TSA RUE: Unable to fully assess due to pain;Unable to fully assess due to immobilization RUE Coordination: decreased gross motor   Lower Extremity Assessment Lower Extremity Assessment: Overall WFL for tasks assessed   Cervical / Trunk Assessment Cervical / Trunk Assessment: Normal   Communication Communication Communication: No difficulties   Cognition Arousal/Alertness: Awake/alert Behavior During Therapy: WFL for tasks assessed/performed Overall Cognitive Status: Within Functional Limits for tasks assessed                        Exercises Exercises: Shoulder     Shoulder Instructions Shoulder Instructions Donning/doffing shirt without moving shoulder: Moderate assistance;Caregiver independent with task Method for sponge bathing under operated UE: Minimal assistance;Caregiver independent with task Donning/doffing sling/immobilizer: Moderate assistance;Caregiver independent with task Correct positioning of sling/immobilizer: Moderate assistance;Caregiver independent with task ROM for elbow, wrist and digits of operated UE: Independent Sling wearing schedule (on at all times/off for ADL's): Independent Proper positioning of operated  UE when showering: Independent Positioning of UE while sleeping: Independent    Home Living Family/patient expects to be discharged to:: Private residence Living  Arrangements: Spouse/significant other Available Help at Discharge: Family;Available 24 hours/day                                    Prior Functioning/Environment Level of Independence: Independent             OT Diagnosis: Generalized weakness;Acute pain    End of Session Equipment Utilized During Treatment: Gait belt;Other (comment) (sling) Nurse Communication: Other (comment) (pt ready for d/c from OT standpoint)  Activity Tolerance: Patient tolerated treatment well Patient left: in chair;with call bell/phone within reach;with family/visitor present   Time: 0903-0950 OT Time Calculation (min): 47 min Charges:  OT General Charges $OT Visit: 1 Procedure OT Evaluation $Initial OT Evaluation Tier I: 1 Procedure OT Treatments $Self Care/Home Management : 8-22 mins $Therapeutic Exercise: 8-22 mins G-Codes:    Rae LipsMiller, Kymir Coles M 10/26/2014, 10:25 AM   Carney LivingLeeAnn Marie Emrik Erhard, OTR/L Occupational Therapist [phone] (pager)

## 2014-10-26 NOTE — Progress Notes (Signed)
   PATIENT ID: Ros[place] MinersGeorge D Schnepf   1 Day Post-Op Procedure(s) (LRB): TOTAL SHOULDER ARTHROPLASTY (Right)  Subjective: Doing well.  Initially trouble voiding postop, but resolved.  Pain minimal.  Objective:  Filed Vitals:   10/26/14 0532  BP: 138/74  Pulse: 58  Temp: 98.4 F (36.9 C)  Resp: 16     R UE dressing C/D/I, NVI, firing deltoid  Labs:   Recent Labs  10/26/14 0505  HGB 12.3*   Recent Labs  10/26/14 0505  WBC 9.3  RBC 3.64*  HCT 35.9*  PLT 193   Recent Labs  10/26/14 0505  NA 136  K 4.1  CL 100  CO2 27  BUN 14  CREATININE 1.03  GLUCOSE 163*  CALCIUM 8.3*    Assessment and Plan:  POD1 s/p R TSA 30/130 PROM with OT D/c home today F/u 2wks.  VTE proph: ECASA bid, SCDs

## 2014-10-26 NOTE — Progress Notes (Signed)
Patient ready for discharge. Wife will provide transportation. 

## 2014-10-28 NOTE — Anesthesia Postprocedure Evaluation (Signed)
  Anesthesia Post-op Note  Patient: Eric NiemannGeorge D Brewer  Procedure(s) Performed: Procedure(s) with comments: TOTAL SHOULDER ARTHROPLASTY (Right) - Right total shoulder arthroplasty  Patient Location: PACU  Anesthesia Type:General  Level of Consciousness: awake and alert   Airway and Oxygen Therapy: Patient Spontanous Breathing  Post-op Pain: none  Post-op Assessment: Post-op Vital signs reviewed  Post-op Vital Signs: stable  Last Vitals:  Filed Vitals:   10/26/14 0532  BP: 138/74  Pulse: 58  Temp: 36.9 C  Resp: 16    Complications: No apparent anesthesia complications

## 2014-10-29 ENCOUNTER — Encounter (HOSPITAL_COMMUNITY): Payer: Self-pay | Admitting: Orthopedic Surgery

## 2014-10-29 NOTE — Discharge Summary (Signed)
Patient ID: Eric Brewer MRN: [id number] DOB/AGE: 1940/11/03 74 y.o.  Admit date: 10/25/2014 Discharge date: 10/26/2014  Admission Diagnoses:  Active Problems:   Status post total shoulder arthroplasty   Discharge Diagnoses:  Same  Past Medical History  Diagnosis Date  . COPD (chronic obstructive pulmonary disease)     MINIMAL   . Arthritis   . History of kidney stones     Surgeries: Procedure(s): TOTAL SHOULDER ARTHROPLASTY on 10/25/2014   Consultants:    Discharged Condition: Improved  Hospital Course: Eric Brewer is an 74 y.o. male who was admitted 10/25/2014 for operative treatment of right shoulder osteoarthritis. Patient has severe unremitting pain that affects sleep, daily activities, and work/hobbies. After pre-op clearance the patient was taken to the operating room on 10/25/2014 and underwent  Procedure(s): TOTAL SHOULDER ARTHROPLASTY.    Patient was given perioperative antibiotics:  Anti-infectives    Start     Dose/Rate Route Frequency Ordered Stop   10/25/14 1615  ceFAZolin (ANCEF) IVPB 2 g/50 mL premix  Status:  Discontinued     2 g100 mL/hr over 30 Minutes Intravenous Every 6 hours 10/25/14 1304 10/26/14 1311   10/25/14 0600  ceFAZolin (ANCEF) IVPB 2 g/50 mL premix     2 g100 mL/hr over 30 Minutes Intravenous On call to O.R. 10/24/14 1353 10/25/14 1015       Patient was given sequential compression devices, early ambulation, and ASA  BID to prevent DVT.  Patient benefited maximally from hospital stay and there were no complications.    Recent vital signs: No data found.    Recent laboratory studies: No results for input(s): WBC, HGB, HCT, PLT, NA, K, CL, CO2, BUN, CREATININE, GLUCOSE, INR, CALCIUM in the last 72 hours.  Invalid input(s): PT, 2   Discharge Medications:     Medication List    STOP taking these medications        ibuprofen 200 MG tablet  Commonly known as:  ADVIL,MOTRIN      TAKE these medications        aspirin 325  MG tablet  Take 650 mg by mouth daily as needed for mild pain or moderate pain.     docusate sodium 100 MG capsule  Commonly known as:  COLACE  Take 1 capsule (100 mg total) by mouth 3 (three) times daily as needed.     DUREZOL 0.05 % Emul  Generic drug:  Difluprednate  Place 1 drop into the right eye 2 (two) times daily.     FISH OIL PO  Take 1 capsule by mouth daily.     montelukast 10 MG tablet  Commonly known as:  SINGULAIR  Take 1 tablet by mouth at bedtime.     multivitamin with minerals tablet  Take 1 tablet by mouth daily.     oxyCODONE-acetaminophen 5-325 MG per tablet  Commonly known as:  ROXICET  Take 1-2 tablets by mouth every 4 (four) hours as needed for severe pain.     simvastatin 40 MG tablet  Commonly known as:  ZOCOR  Take 1 tablet by mouth daily.        Diagnostic Studies: Dg Chest 2 View  10/19/2014   CLINICAL DATA:  Preop left shoulder surgery.  No chest complaints.  EXAM: CHEST  2 VIEW  COMPARISON:  None.  FINDINGS: The heart size and mediastinal contours are within normal limits. Both lungs are clear. The visualized skeletal structures are unremarkable.  IMPRESSION: No active cardiopulmonary disease.   Electronically Signed  By: Charlett NoseKevin  Dover M.D.   On: 10/19/2014 16:28   Ct Shoulder Right Wo Contrast  10/15/2014   CLINICAL DATA:  Preoperative evaluation for RIGHT shoulder pain. History of fall 2 years ago. Initial encounter.  EXAM: CT OF THE RIGHT SHOULDER WITHOUT CONTRAST  TECHNIQUE: Multidetector CT imaging was performed according to the standard protocol. Multiplanar CT image reconstructions were also generated.  COMPARISON:  None.  FINDINGS: Mild respiratory motion artifact is present in the lungs. No pulmonary lesions.  RIGHT glenohumeral osteoarthritis is severe. There is no rotator cuff muscular atrophy. The acromion is mildly type 3, with minimal anterior downward hooking. Mild AC joint osteoarthritis.  There is moderate loss of bone stock in the  glenoid with mild loss of bone stock in the humeral head. Subchondral cysts are present along both glenohumeral joint surfaces. Glenohumeral effusion distends the superior SUBSCAPULARIS recess and biceps long head tendon sheath.  IMPRESSION: 1. Severe glenohumeral osteoarthritis. 2. Mild glenohumeral osteoarthritis. 3. No rotator cuff muscular atrophy to suggest a chronic tear. 4. Large degenerative glenohumeral effusion.   Electronically Signed   By: Andreas NewportGeoffrey  Lamke M.D.   On: 10/15/2014 15:27   Dg Shoulder Right Port  10/25/2014   CLINICAL DATA:  Status post right total shoulder arthroplasty.  EXAM: PORTABLE RIGHT SHOULDER - 2+ VIEW  COMPARISON:  CT scan of October 15, 2014.  FINDINGS: Status post right shoulder arthroplasty. Prosthesis appears to be well situated. No fracture or dislocation is noted. Visualized ribs appear normal.  IMPRESSION: Status post right shoulder arthroplasty.   Electronically Signed   By: Roque LiasJames  Green M.D.   On: 10/25/2014 13:32    Disposition: 01-Home or Self Care      Discharge Instructions    Call MD / Call 911    Complete by:  As directed   If you experience chest pain or shortness of breath, CALL 911 and be transported to the hospital emergency room.  If you develope a fever above 101 F, pus (white drainage) or increased drainage or redness at the wound, or calf pain, call your surgeon's office.     Constipation Prevention    Complete by:  As directed   Drink plenty of fluids.  Prune juice may be helpful.  You may use a stool softener, such as Colace (over the counter) 100 mg twice a day.  Use MiraLax (over the counter) for constipation as needed.     Diet - low sodium heart healthy    Complete by:  As directed      Increase activity slowly as tolerated    Complete by:  As directed            Follow-up Information    Follow up with Mable ParisHANDLER,JUSTIN WILLIAM, MD. Schedule an appointment as soon as possible for a visit in 2 weeks.   Specialty:  Orthopedic  Surgery   Contact information:   [address] [place] [address] [phone]        Signed: Jiles HaroldLALIBERTE, Zeffie Bickert 10/29/2014, 2:11 PM

## 2019-10-25 ENCOUNTER — Ambulatory Visit: Payer: Medicare Other | Attending: Internal Medicine

## 2019-10-25 DIAGNOSIS — Z23 Encounter for immunization: Secondary | ICD-10-CM | POA: Insufficient documentation

## 2019-10-25 NOTE — Progress Notes (Signed)
   Covid-19 Vaccination Clinic  Name:  MELFORD TULLIER    MRN: [id number] DOB: [birth year]  10/25/2019  Mr. Bruntz was observed post Covid-19 immunization for 15 minutes without incidence. He was provided with Vaccine Information Sheet and instruction to access the V-Safe system.   Mr. Kochan was instructed to call 911 with any severe reactions post vaccine: Marland Kitchen Difficulty breathing  . Swelling of your face and throat  . A fast heartbeat  . A bad rash all over your body  . Dizziness and weakness    Immunizations Administered    Name Date Dose VIS Date Route   Pfizer COVID-19 Vaccine 10/25/2019 10:42 AM 0.3 mL 09/15/2019 Intramuscular   Manufacturer: [employer], [employer]   Lot: [id number]   NDC: [address]-1

## 2019-11-15 ENCOUNTER — Ambulatory Visit: Payer: Medicare Other | Attending: Internal Medicine

## 2019-11-15 DIAGNOSIS — Z23 Encounter for immunization: Secondary | ICD-10-CM | POA: Insufficient documentation

## 2019-11-15 NOTE — Progress Notes (Signed)
   Covid-19 Vaccination Clinic  Name:  Eric Brewer    MRN: [id number] DOB: 1941/02/18  11/15/2019  Eric Brewer was observed post Covid-19 immunization for 15 minutes without incidence. He was provided with Vaccine Information Sheet and instruction to access the V-Safe system.   Eric Brewer was instructed to call 911 with any severe reactions post vaccine: Marland Kitchen Difficulty breathing  . Swelling of your face and throat  . A fast heartbeat  . A bad rash all over your body  . Dizziness and weakness    Immunizations Administered    Name Date Dose VIS Date Route   Pfizer COVID-19 Vaccine 11/15/2019  2:25 PM 0.3 mL 09/15/2019 Intramuscular   Manufacturer: [employer], [employer]   Lot: [id number]   NDC: [address]-9

## 2020-06-16 ENCOUNTER — Other Ambulatory Visit: Payer: Self-pay

## 2020-06-16 ENCOUNTER — Emergency Department (HOSPITAL_COMMUNITY): Payer: Medicare Other

## 2020-06-16 ENCOUNTER — Encounter (HOSPITAL_COMMUNITY): Payer: Self-pay | Admitting: Emergency Medicine

## 2020-06-16 ENCOUNTER — Observation Stay (HOSPITAL_COMMUNITY): Payer: Medicare Other

## 2020-06-16 ENCOUNTER — Observation Stay (HOSPITAL_COMMUNITY)
Admission: EM | Admit: 2020-06-16 | Discharge: 2020-06-17 | Disposition: A | Discharge status: Home / Self Care | Payer: Medicare Other | Attending: Family Medicine | Admitting: Family Medicine

## 2020-06-16 DIAGNOSIS — Z96611 Presence of right artificial shoulder joint: Secondary | ICD-10-CM | POA: Insufficient documentation

## 2020-06-16 DIAGNOSIS — Z20822 Contact with and (suspected) exposure to covid-19: Secondary | ICD-10-CM | POA: Insufficient documentation

## 2020-06-16 DIAGNOSIS — Z7982 Long term (current) use of aspirin: Secondary | ICD-10-CM | POA: Diagnosis not present

## 2020-06-16 DIAGNOSIS — I639 Cerebral infarction, unspecified: Principal | ICD-10-CM | POA: Diagnosis present

## 2020-06-16 DIAGNOSIS — R410 Disorientation, unspecified: Secondary | ICD-10-CM | POA: Diagnosis present

## 2020-06-16 DIAGNOSIS — Z87891 Personal history of nicotine dependence: Secondary | ICD-10-CM | POA: Insufficient documentation

## 2020-06-16 DIAGNOSIS — J449 Chronic obstructive pulmonary disease, unspecified: Secondary | ICD-10-CM | POA: Diagnosis not present

## 2020-06-16 LAB — DIFFERENTIAL
Abs Immature Granulocytes: 0.03 10*3/uL (ref 0.00–0.07)
Basophils Absolute: 0.1 10*3/uL (ref 0.0–0.1)
Basophils Relative: 1 %
Eosinophils Absolute: 0.1 10*3/uL (ref 0.0–0.5)
Eosinophils Relative: 1 %
Immature Granulocytes: 0 %
Lymphocytes Relative: 12 %
Lymphs Abs: 0.9 10*3/uL (ref 0.7–4.0)
Monocytes Absolute: 0.6 10*3/uL (ref 0.1–1.0)
Monocytes Relative: 8 %
Neutro Abs: 5.7 10*3/uL (ref 1.7–7.7)
Neutrophils Relative %: 78 %

## 2020-06-16 LAB — COMPREHENSIVE METABOLIC PANEL
ALT: 19 U/L (ref 0–44)
AST: 28 U/L (ref 15–41)
Albumin: 4.1 g/dL (ref 3.5–5.0)
Alkaline Phosphatase: 42 U/L (ref 38–126)
Anion gap: 11 (ref 5–15)
BUN: 24 mg/dL — ABNORMAL HIGH (ref 8–23)
CO2: 27 mmol/L (ref 22–32)
Calcium: 10 mg/dL (ref 8.9–10.3)
Chloride: 105 mmol/L (ref 98–111)
Creatinine, Ser: 1.13 mg/dL (ref 0.61–1.24)
GFR calc Af Amer: >60 mL/min (ref >60)
GFR calc non Af Amer: >60 mL/min (ref >60)
Glucose, Bld: 130 mg/dL — ABNORMAL HIGH (ref 70–99)
Potassium: 4.8 mmol/L (ref 3.5–5.1)
Sodium: 143 mmol/L (ref 135–145)
Total Bilirubin: 0.8 mg/dL (ref 0.3–1.2)
Total Protein: 7 g/dL (ref 6.5–8.1)

## 2020-06-16 LAB — CBC
HCT: 40.9 % (ref 39.0–52.0)
Hemoglobin: 13.7 g/dL (ref 13.0–17.0)
MCH: 35.1 pg — ABNORMAL HIGH (ref 26.0–34.0)
MCHC: 33.5 g/dL (ref 30.0–36.0)
MCV: 104.9 fL — ABNORMAL HIGH (ref 80.0–100.0)
Platelets: 217 10*3/uL (ref 150–400)
RBC: 3.9 MIL/uL — ABNORMAL LOW (ref 4.22–5.81)
RDW: 12.6 % (ref 11.5–15.5)
WBC: 7.2 10*3/uL (ref 4.0–10.5)
nRBC: 0 % (ref 0.0–0.2)

## 2020-06-16 LAB — PROTIME-INR
INR: 1 (ref 0.8–1.2)
Prothrombin Time: 12.5 seconds (ref 11.4–15.2)

## 2020-06-16 LAB — APTT: aPTT: 26 seconds (ref 24–36)

## 2020-06-16 LAB — SARS CORONAVIRUS 2 BY RT PCR (HOSPITAL ORDER, PERFORMED IN ~~LOC~~ HOSPITAL LAB): SARS Coronavirus 2: NEGATIVE

## 2020-06-16 MED ORDER — ACETAMINOPHEN 160 MG/5ML PO SOLN: 650.0000 mg | ORAL | Status: DC | PRN

## 2020-06-16 MED ORDER — ACETAMINOPHEN 325 MG PO TABS: 650.0000 mg | ORAL_TABLET | ORAL | Status: DC | PRN

## 2020-06-16 MED ORDER — PREDNISONE 5 MG PO TABS
5.0000 mg | ORAL_TABLET | Freq: Every day | ORAL | Status: DC
  Administered 2020-06-17: 5 mg via ORAL
  Filled 2020-06-16 (×2): qty 1

## 2020-06-16 MED ORDER — ASPIRIN 325 MG PO TABS: 650.0000 mg | ORAL_TABLET | Freq: Every day | ORAL | Status: DC | PRN

## 2020-06-16 MED ORDER — ENOXAPARIN SODIUM 40 MG/0.4ML ~~LOC~~ SOLN: 40.0000 mg | SUBCUTANEOUS | Status: DC

## 2020-06-16 MED ORDER — IOHEXOL 350 MG/ML SOLN
75.0000 mL | Freq: Once | INTRAVENOUS | Status: AC | PRN
  Administered 2020-06-16: 75 mL via INTRAVENOUS

## 2020-06-16 MED ORDER — SODIUM CHLORIDE 0.9% FLUSH: 3.0000 mL | Freq: Once | INTRAVENOUS | Status: DC

## 2020-06-16 MED ORDER — ATORVASTATIN CALCIUM 40 MG PO TABS
80.0000 mg | ORAL_TABLET | Freq: Every day | ORAL | Status: DC
  Administered 2020-06-17: 80 mg via ORAL
  Filled 2020-06-16: qty 1

## 2020-06-16 MED ORDER — STROKE: EARLY STAGES OF RECOVERY BOOK: Freq: Once | Status: DC

## 2020-06-16 MED ORDER — ACETAMINOPHEN 650 MG RE SUPP: 650.0000 mg | RECTAL | Status: DC | PRN

## 2020-06-16 NOTE — ED Notes (Signed)
Neuro person at  The bedside

## 2020-06-16 NOTE — ED Notes (Signed)
The pt has had memory lapses for 2 years this past Thursday he woke up with prickles in his lt arm  He woke up this am with and not thinking normally with some different peripheral vision  He was going somewhere in his car this after noon and he was so confused that he could not get out of his neighborhood  He has had a corneal transplant lt eye.  The pt laughs with every question  Maybe a nervous laugh

## 2020-06-16 NOTE — H&P (Addendum)
Family Medicine Teaching [facility] Admission History and Physical Service Pager: [phone]  Patient name: Eric Brewer Medical record number: [id number] Date of birth: 06/19/1941 Age: 79 y.o. Gender: male  Primary Care Provider: Patient, No Pcp Per Consultants: neuro Code Status: Full Preferred Emergency Contact: Jeison Delpilar [phone]  Chief Complaint: confusion  Assessment and Plan: Eric Brewer is a 79 y.o. 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  PMH is significant for HTN, HLD, former smoker, and COPD.  Right sided CVA Patient presenting with intermittent left peripheral vision changes, confusion with directions, and left arm tingling starting 9/9. Risk factors include former smoker with 30 pack year smoking history, HLD, and hypertension. Currently hemodynamically stable. MRI notable for large acute/early subacute right PCA territory infarction predominantly affecting medial right temporal lobe and right occipital lobe. Mild associated petechial hemorrhage. CT with subacute infarct at medial aspect of posterior right temporal lobe extending into right occipital lobe. High attenuation thrombus within right posterior cerebral arery branch extending to infarcted segment. Neuro exam normal except for significant left field visual deficits.  Less likely encephalitis given MRI/CT findings, afebrile and normal WBC.  Less likely electrolyte abnormality causing symptoms given normal CMP.  Patient believes symptoms first started 3 days ago so he is out of tPA window.  Neuro consulted in ED who recommend admission and stroke work up.   - Admit to Tele, Dr Deirdre Priest Attending - neuro consulted, appreciate recs - Patient speaking normally and no issues with eating, fine giving diet and taking oral meds before swallow study - A1C, Lipid Panel in morning - Repeat CBC, BMP - PT/OT ordered - EKG pending - ECHO  pending - CTA Angio head and neck ordered - Hold off Lovenox and ASA therapy given petechial hemorrhage on MRI - heart healthy diet - Permissive HTN over next 24 hours - Transition to Simvastatin to high intensity Atorvastatin 80mg  QD with LDL goal <70  COPD, stable Followed by pulmonologist Dr , last seen on 03/21/20. Home meds: Prednisone 5 mg qd, given lack of improvement with multiple inhaled medications. Lung exam WNL on admission.  No cough or difficulty breathing.  - continue Prendisone 5mg  QD  Hypertension BP 171/111 on admission. Home meds: Olmesartan 20mg  QD. - hold home meds for permissive HTN  Hyperlipidemia Last lipid panel 11/24/19. Cholesterol- 197, Trig-198, HDL-87, LDL-90.  Home meds: Simvastatin 17m QD.  - Stop Simvastatin, start Atorvastatin 80 mg  FEN/GI: Heart healthy diet Prophylaxis: SCDs, ambulation when able  Disposition: Tele  History of Present Illness:  Eric Brewer is a 79 y.o. male presenting with left sided vision changes, left upper extremity paresthesias, and confusion that stated 2-3 days ago.  He notes that he developed "tingling" in his left arm on 9/9 that lasted a few minutes and self reslved. Denies any weakness. Denies any symptoms in his right arm or lower extremities. He notes that he worked out at [employer] the following day without difficulty.  He noted soon after the LUE tingling, he developed vision changes. He notes that he would have peripheral visual changes where he felt "there was a shadow". This would come and go. Denies any current vision changes. He notes that this was off and on for several days. He thought this was related to corneal transplant that occurred 20 years ago (around 79 years old). He also had history of cataract surgery in his right eye.  He finally developed confusion this morning when he was confused trying to leave his neighborhood and couldn't remember what turns to make. He went home after this and  told his wife. Wife then drove patient to [facility] neighborhood emergency department. He described his symptoms and told him to come here.   Former smoker: 1 PPD x 30 years, quit in 2008. Alcohol: Martini in afternoon, 2-3 glasses of wine in evenings, daily Illicit drugs none.  Last medications taken was yesterday.  Review Of Systems: Per HPI with the following additions:  Review of Systems  Constitutional: Negative for chills and fever.  HENT: Negative for congestion and sore throat.   Respiratory: Negative for cough, chest tightness and shortness of breath.   Cardiovascular: Negative for chest pain and palpitations.  Gastrointestinal: Negative for abdominal pain, diarrhea, nausea and vomiting.     Patient Active Problem List   Diagnosis Date Noted  . Status post total shoulder arthroplasty 10/25/2014    Past Medical History: Past Medical History:  Diagnosis Date  . Arthritis   . COPD (chronic obstructive pulmonary disease) (HCC)    MINIMAL   . History of kidney stones     Past Surgical History: Past Surgical History:  Procedure Laterality Date  . CATARACT EXTRACTION W/ INTRAOCULAR LENS IMPLANT     RIGHT  . 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 TONSILLECTOMY    . TOTAL SHOULDER ARTHROPLASTY Right 10/25/2014   Procedure: TOTAL SHOULDER ARTHROPLASTY;  Surgeon: Mable Paris, MD;  Location: [facility] OR;  Service: Orthopedics;  Laterality: Right;  Right total shoulder arthroplasty    Social History: Social History   Tobacco Use  . Smoking status: Former Smoker  Substance Use Topics  . Alcohol use: Yes    Comment: GLASS WINE OR DRINK DAILY  . Drug use: No   Additional social history:  Please also refer to relevant sections of EMR.  Family History: No family history on file.  Allergies and Medications: No Known Allergies No current facility-administered medications on file prior to encounter.   Current Outpatient Medications on File Prior to Encounter   Medication Sig Dispense Refill  . aspirin 325 MG tablet Take 650 mg by mouth daily as needed for mild pain or moderate pain.    . diphenhydrAMINE (BENADRYL) 25 MG tablet Take 50 mg by mouth at bedtime as needed for sleep.    Marland Kitchen docusate sodium (COLACE) 100 MG capsule Take 1 capsule (100 mg total) by mouth 3 (three) times daily as needed. 20 capsule 0  . ibuprofen (ADVIL) 200 MG tablet Take 200 mg by mouth every 6 (six) hours as needed for mild pain.    . montelukast (SINGULAIR) 10 MG tablet Take 1 tablet by mouth at bedtime.  3  . Multiple Vitamins-Minerals (MULTIVITAMIN WITH MINERALS) tablet Take 1 tablet by mouth daily.    Marland Kitchen olmesartan (BENICAR) 20 MG tablet Take 20 mg by mouth daily.    . Omega-3 Fatty Acids (FISH OIL PO) Take 1 capsule by mouth daily.    . predniSONE (DELTASONE) 5 MG tablet Take 5 mg by mouth daily with breakfast.    . simvastatin (ZOCOR) 40 MG tablet Take 1 tablet by mouth daily.  1  . vitamin B-12 (CYANOCOBALAMIN) 1000 MCG tablet Take 1,000 mcg by mouth daily.    . vitamin E (VITAMIN E) 180 MG (400 UNITS) capsule Take 400 Units by mouth daily.    . DUREZOL 0.05 % EMUL Place 1 drop  into the right eye 2 (two) times daily. (Patient not taking: Reported on 06/16/2020)  2  . oxyCODONE-acetaminophen (ROXICET) 5-325 MG per tablet Take 1-2 tablets by mouth every 4 (four) hours as needed for severe pain. (Patient not taking: Reported on 06/16/2020) 60 tablet 0    Objective: BP (!) 196/91   Pulse 72   Temp 98.3 F (36.8 C) (Oral)   Resp (!) 22   SpO2 96%  Exam:  Physical Exam Constitutional:      General: He is not in acute distress.    Appearance: He is not ill-appearing.  HENT:     Head: Normocephalic and atraumatic.     Mouth/Throat:     Mouth: Mucous membranes are moist.     Pharynx: Oropharynx is clear. No oropharyngeal exudate or posterior oropharyngeal erythema.  Eyes:     General: Visual field deficit present.     Extraocular Movements: Extraocular movements  intact.     Conjunctiva/sclera: Conjunctivae normal.     Pupils: Pupils are equal, round, and reactive to light.  Cardiovascular:     Rate and Rhythm: Normal rate and regular rhythm.     Pulses: Normal pulses.          Radial pulses are 2+ on the right side and 2+ on the left side.       Dorsalis pedis pulses are 2+ on the right side and 2+ on the left side.     Heart sounds: Normal heart sounds. No murmur heard.  No friction rub. No gallop.      Comments: Did not hear Carotid Bruits Pulmonary:     Effort: No respiratory distress.  Musculoskeletal:     Cervical back: Normal range of motion.     Right lower leg: No edema.     Left lower leg: No edema.  Skin:    General: Skin is warm.     Capillary Refill: Capillary refill takes less than 2 seconds.  Neurological:     Mental Status: He is alert and oriented to person, place, and time.     Sensory: Sensation is intact.     Motor: Motor function is intact. No weakness.     Coordination: Coordination is intact. Romberg sign negative. Coordination normal. Finger-Nose-Finger Test normal.     Gait: Gait is intact.     Comments: Patient has significant left visual field deficit.  Has bilateral auditory deficits (possibly due to age).  CN3-12 otherwise intact.     Labs and Imaging: CBC BMET  Recent Labs  Lab 06/16/20 1111  WBC 7.2  HGB 13.7  HCT 40.9  PLT 217   Recent Labs  Lab 06/16/20 1111  NA 143  K 4.8  CL 105  CO2 27  BUN 24*  CREATININE 1.13  GLUCOSE 130*  CALCIUM 10.0     EKG: Pending   EXAM: CT HEAD WITHOUT CONTRAST  TECHNIQUE: Contiguous axial images were obtained from the base of the skull through the vertex without intravenous contrast. Sagittal and coronal MPR images reconstructed from axial data set.  COMPARISON:  None  FINDINGS: Brain: Generalized atrophy. Normal ventricular morphology. No midline shift or mass effect. Small vessel chronic ischemic changes of deep cerebral white matter.  Subacute infarct identified at medial aspect of posterior RIGHT temporal lobe extending into RIGHT occipital lobe. No definite intracranial hemorrhage, mass lesion, or extra-axial fluid collection.  Vascular: High attenuation thrombus identified within RIGHT posterior cerebral artery branch extending to the infarcted segment. Atherosclerotic calcification of internal  carotid arteries at skull base.  Skull: Intact  Sinuses/Orbits: Clear  Other: N/A  IMPRESSION: Atrophy with small vessel chronic ischemic changes of deep cerebral white matter.  Subacute infarct at medial aspect of posterior RIGHT temporal lobe extending into RIGHT occipital lobe.  High attenuation thrombus identified within RIGHT posterior cerebral artery branch extending to the infarcted segment.  Findings called to Dr. Denton Lank on 06/16/2020 at 1143 hours.   Electronically Signed   By: Ulyses Southward M.D.   On: 06/16/2020 11:44  EXAM: MRI HEAD WITHOUT CONTRAST  TECHNIQUE: Multiplanar, multiecho pulse sequences of the brain and surrounding structures were obtained without intravenous contrast.  COMPARISON:  Noncontrast head CT performed earlier the same day 06/16/2020.  FINDINGS: Brain:  Mild intermittent motion degradation.  Mild generalized parenchymal atrophy  There is a large acute/early subacute right PCA territory infarct predominantly affecting the medial right temporal lobe and right occipital lobe. Corresponding T2/FLAIR hyperintensity at this site. No significant mass effect. Subtle SWI signal loss at the infarction site likely reflecting petechial hemorrhage  Mild multifocal T2/FLAIR hyperintensity within the cerebral white matter is nonspecific, but consistent with chronic small vessel ischemic disease.  Prominent perivascular space versus small chronic lacunar infarct within the right basal ganglia (series 6, image 15).  Probable tiny chronic infarcts within the  bilateral cerebellar hemispheres.  No evidence of intracranial mass.  No extra-axial fluid collection.  Vascular: The right posterior cerebral artery is likely occluded. Otherwise, there are expected flow voids within the proximal large arterial vessels  Skull and upper cervical spine: No focal marrow lesion  Sinuses/Orbits: Visualized orbits show no acute finding. Prior right lens replacement. Minimal scattered paranasal sinus mucosal thickening. No significant mastoid effusion  IMPRESSION: Large acute/early subacute right PCA territory infarction predominantly affecting the medial right temporal lobe and right occipital lobe. Mild associated petechial hemorrhage. No significant mass effect.  Background mild generalized parenchymal atrophy and cerebral white matter chronic small vessel ischemic disease.  Prominent perivascular space versus small chronic lacunar infarct within the right basal ganglia.  Probable tiny chronic infarcts within the bilateral cerebellar hemispheres.   Electronically Signed   By: Jackey Loge DO   On: 06/16/2020 16:38   Jovita Kussmaul, MD 06/16/2020, 5:48 PM PGY-1, [facility] FPTS Intern pager: [phone], text pages welcome  Upper Level Addendum:  I have seen and evaluated this patient along with Dr. Pecola Leisure and reviewed the above note, making necessary revisions in green.   General: pleasant older gentleman, sitting comfortably on side of bed eating snack, well nourished, well developed, in no acute distress with non-toxic appearance HEENT: normocephalic, atraumatic, moist mucous membranes, oropharynx clear without erythema or exudate, uvula midline, EOMI, PERRL Neck: supple, no carotid bruits appreciated CV: regular rate and rhythm without murmurs, rubs, or gallops, no lower extremity edema, 2+ radial and pedal pulses bilaterally Lungs: clear to auscultation bilaterally with normal work of breathing on room air,  speaking in full sentences Abdomen: soft, non-tender, non-distended Skin: warm, dry Extremities: warm and well perfused MSK: gait normal Neuro: Alert and oriented, speech normal, CN II-XII intact, strength 5/5 UE and LE bilaterally, decreased hearing bilaterally, normal finger-to-nose, normal Romberg, impaired left sided visual fields, normal gait  Orpah Cobb, D.O. 06/17/2020, 8:19 AM PGY-2, [facility]

## 2020-06-16 NOTE — ED Notes (Signed)
To ct

## 2020-06-16 NOTE — Consult Note (Addendum)
NEURO HOSPITALIST  CONSULT   Requesting Physician: Dr. Particia Nearing   Chief Complaint: AMS History obtained from:  Patient / chart  HPI:                                                                                                                                         Eric Brewer is an 79 y.o. male  With PMH COPD, HTN,HLD, corneal transplant who presented to the ED with  C/o confusion.  Per patient last Thursday he had left arm tingling and numbness that resolved. He also stated that his peripheral vision has been affected. Today he planned to drive to [place], but had a hard time leaving his neighborhood and decided not to go. He does state that he feels a bit off balance at times, but that is nothing new, it has been present for awhile. At baseline does not need any assistive devices. Lives with his wife. Able to drive and manage bills. He does state that he has short term memory problems, but has never seen anyone about them. Denies drug use, smoking. Endorses 2 glasses of wine and 1 martini nightly.     ED course:  CTH: Subacute infarct at medial aspect of posterior RIGHT temporal lobe extending into RIGHT occipital lobe.  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     LKW 06/14/20 tPA Given: no; outside window Modified Rankin: Rankin Score=0 NIHSS:1  Past Medical History:  Diagnosis Date   Arthritis    COPD (chronic obstructive pulmonary disease) (HCC)    MINIMAL    History of kidney stones     Past Surgical History:  Procedure Laterality Date   CATARACT EXTRACTION W/ INTRAOCULAR LENS IMPLANT     RIGHT   CORNEAL TRANSPLANT     1996 LEFT EYE   TONSILLECTOMY     TOTAL SHOULDER ARTHROPLASTY Right 10/25/2014   Procedure: TOTAL SHOULDER ARTHROPLASTY;  Surgeon: Mable Paris, MD;  Location: [facility] OR;  Service: Orthopedics;  Laterality: Right;  Right total shoulder arthroplasty    No family history on  file.      Social History:  reports that he has quit smoking. He does not have any smokeless tobacco history on file. He reports current alcohol use. He reports that he does not use drugs.  Allergies: No Known Allergies  Medications:  Current Facility-Administered Medications  Medication Dose Route Frequency Provider Last Rate Last Admin   sodium chloride flush (NS) 0.9 % injection 3 mL  3 mL Intravenous Once Jacalyn Lefevre, MD       Current Outpatient Medications  Medication Sig Dispense Refill   aspirin 325 MG tablet Take 650 mg by mouth daily as needed for mild pain or moderate pain.     diphenhydrAMINE (BENADRYL) 25 MG tablet Take 50 mg by mouth at bedtime as needed for sleep.     docusate sodium (COLACE) 100 MG capsule Take 1 capsule (100 mg total) by mouth 3 (three) times daily as needed. 20 capsule 0   ibuprofen (ADVIL) 200 MG tablet Take 200 mg by mouth every 6 (six) hours as needed for mild pain.     montelukast (SINGULAIR) 10 MG tablet Take 1 tablet by mouth at bedtime.  3   Multiple Vitamins-Minerals (MULTIVITAMIN WITH MINERALS) tablet Take 1 tablet by mouth daily.     olmesartan (BENICAR) 20 MG tablet Take 20 mg by mouth daily.     Omega-3 Fatty Acids (FISH OIL PO) Take 1 capsule by mouth daily.     predniSONE (DELTASONE) 5 MG tablet Take 5 mg by mouth daily with breakfast.     simvastatin (ZOCOR) 40 MG tablet Take 1 tablet by mouth daily.  1   vitamin B-12 (CYANOCOBALAMIN) 1000 MCG tablet Take 1,000 mcg by mouth daily.     vitamin E (VITAMIN E) 180 MG (400 UNITS) capsule Take 400 Units by mouth daily.     DUREZOL 0.05 % EMUL Place 1 drop into the right eye 2 (two) times daily. (Patient not taking: Reported on 06/16/2020)  2   oxyCODONE-acetaminophen (ROXICET) 5-325 MG per tablet Take 1-2 tablets by mouth every 4 (four) hours as needed for severe pain.  (Patient not taking: Reported on 06/16/2020) 60 tablet 0     ROS:                                                                                                                                       ROS was performed and is negative except as noted in HPI    General Examination:                                                                                                      Blood pressure (!) 191/85, pulse 63, temperature 98.3 F (36.8 C), temperature source Oral, resp. rate 18, SpO2 97 %.  Physical Exam  Constitutional: Appears well-developed and well-nourished.  Psych: Affect appropriate to situation Eyes: Normal external eye and conjunctiva. HENT: Normocephalic, no lesions, without obvious abnormality.   Musculoskeletal-no joint tenderness, deformity or swelling Cardiovascular: Normal rate and regular rhythm.  Respiratory: Effort normal, non-labored breathing saturations WNL GI: Soft.  No distension. There is no tenderness.  Skin: WDI  Neurological Examination Mental Status: Alert, oriented name/age/month/year/place situation/city/state, thought content appropriate. naming intact  Speech fluent without evidence of aphasia.  Able to follow  commands without difficulty. Cranial Nerves: II:  Peripheral vision deficits III,IV, VI: ptosis not present, extra-ocular motions intact bilaterally, pupils equal, round, reactive to light  V,VII: smile symmetric, facial light touch sensation normal bilaterally VIII: hearing normal bilaterally IX,X: uvula rises midline XI: bilateral shoulder shrug XII: midline tongue extension Motor: Right : Upper extremity   5/5  Left:     Upper extremity   5/5  Lower extremity   5/5   Lower extremity   5/5 Tone and bulk:normal tone throughout; no atrophy noted Sensory:  light touch intact throughout, bilaterally Deep Tendon Reflexes: 2+ and symmetric biceps and patella Cerebellar: No gross ataxia noted Gait: deferred   Lab Results: Basic  Metabolic Panel: Recent Labs  Lab 06/16/20 1111  NA 143  K 4.8  CL 105  CO2 27  GLUCOSE 130*  BUN 24*  CREATININE 1.13  CALCIUM 10.0    CBC: Recent Labs  Lab 06/16/20 1111  WBC 7.2  NEUTROABS 5.7  HGB 13.7  HCT 40.9  MCV 104.9*  PLT 217   Imaging: CT HEAD WO CONTRAST  Result Date: 06/16/2020 CLINICAL DATA:  Altered mental status, neurological deficit, suspected stroke EXAM: CT HEAD WITHOUT CONTRAST TECHNIQUE: Contiguous axial images were obtained from the base of the skull through the vertex without intravenous contrast. Sagittal and coronal MPR images reconstructed from axial data set. COMPARISON:  None FINDINGS: Brain: Generalized atrophy. Normal ventricular morphology. No midline shift or mass effect. Small vessel chronic ischemic changes of deep cerebral white matter. Subacute infarct identified at medial aspect of posterior RIGHT temporal lobe extending into RIGHT occipital lobe. No definite intracranial hemorrhage, mass lesion, or extra-axial fluid collection. Vascular: High attenuation thrombus identified within RIGHT posterior cerebral artery branch extending to the infarcted segment. Atherosclerotic calcification of internal carotid arteries at skull base. Skull: Intact Sinuses/Orbits: Clear Other: N/A IMPRESSION: Atrophy with small vessel chronic ischemic changes of deep cerebral white matter. Subacute infarct at medial aspect of posterior RIGHT temporal lobe extending into RIGHT occipital lobe. High attenuation thrombus identified within RIGHT posterior cerebral artery branch extending to the infarcted segment. Findings called to Dr. Denton LankSteinl on 06/16/2020 at 1143 hours. Electronically Signed   By: Ulyses SouthwardMark  Boles M.D.   On: 06/16/2020 11:44   MR BRAIN WO CONTRAST  Result Date: 06/16/2020 CLINICAL DATA:  Neuro deficit, acute, stroke suspected. EXAM: MRI HEAD WITHOUT CONTRAST TECHNIQUE: Multiplanar, multiecho pulse sequences of the brain and surrounding structures were obtained  without intravenous contrast. COMPARISON:  Noncontrast head CT performed earlier the same day 06/16/2020. FINDINGS: Brain: Mild intermittent motion degradation. Mild generalized parenchymal atrophy There is a large acute/early subacute right PCA territory infarct predominantly affecting the medial right temporal lobe and right occipital lobe. Corresponding T2/FLAIR hyperintensity at this site. No significant mass effect. Subtle SWI signal loss at the infarction site likely reflecting petechial hemorrhage Mild multifocal T2/FLAIR hyperintensity within the cerebral white matter is nonspecific, but consistent with chronic small vessel ischemic disease. Prominent perivascular space versus small chronic  lacunar infarct within the right basal ganglia (series 6, image 15). Probable tiny chronic infarcts within the bilateral cerebellar hemispheres. No evidence of intracranial mass. No extra-axial fluid collection. Vascular: The right posterior cerebral artery is likely occluded. Otherwise, there are expected flow voids within the proximal large arterial vessels Skull and upper cervical spine: No focal marrow lesion Sinuses/Orbits: Visualized orbits show no acute finding. Prior right lens replacement. Minimal scattered paranasal sinus mucosal thickening. 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 Mild associated petechial hemorrhage. No significant mass effect. Background mild generalized parenchymal atrophy and cerebral white matter chronic small vessel ischemic disease. Prominent perivascular space versus small chronic lacunar infarct within the right basal ganglia. Probable tiny chronic infarcts within the bilateral cerebellar hemispheres. Electronically Signed   By: Jackey Loge DO   On: 06/16/2020 16:38       Valentina Lucks, MSN, NP-C [facility] [phone]  06/16/2020, 5:34 PM   Attending physician note to follow with Assessment and plan .   Assessment: 79 y.o. male   With PMH COPD, HTN,HLD, corneal transplant who presented to the ED with  C/o confusion. CTH showed infarct in right temporal lobe. MRI confirmed right PCA territory infarct. TPA not given d/t presenting outside of the window. Complete stroke work-up needed.   Etiology atheroembolic versus cardioembolic.  Stroke Risk Factors - hyperlipidemia and hypertension    Recommendations: -- BP goal : Permissive HTN upto 220/110 mmHg (for 24-48 post admission)  --CTA head and neck --Echocardiogram -- High intensity Statin if LDL > 70 -- HgbA1c, fasting lipid panel -- PT consult, OT consult, Speech consult --Telemetry monitoring --Frequent neuro checks --Stroke swallow screen   --Please page the Stroke team from 8am-4pm.   You can look them up on [email or web address]

## 2020-06-16 NOTE — ED Triage Notes (Addendum)
Pt woke up from a nap yesterday and reports confusion, tunnel vision, and L arm weakness/numbness.  States weakness and numbness only lasted for a few moments.  Reports he thought lights were out in bathroom last night when they were on.  No arm drift.  Speech clear.  States he is still confused. Denies vision changes at this time.  States him and his wife were going to [place] this morning and he was confused and couldn't find the turn.

## 2020-06-16 NOTE — ED Provider Notes (Signed)
[facility] EMERGENCY DEPARTMENT Provider Note   CSN: [id number] Arrival date & time: 06/16/20  1056    History Chief Complaint  Patient presents with  . Altered Mental Status  . Weakness    Eric Brewer is a 79 y.o. male with a history of COPD, hypertension, hyperlipidemia, nephrolithiasis, & prior corneal transplant who presents to the emergency department with primary complaints of confusion that began a couple of days ago.  Patient states a couple of days ago he had an episode with left upper extremity paresthesias, left eye peripheral vision trouble, as well as some confusion.  He states his left upper extremity paresthesias resolved within a few minutes not reoccurred, however his left eye issues seem to persist and he has had intermittent confusion.  He further describes his confusion this morning as not knowing where to turn when on a familiar route this morning. Does feel a bit off balanced at times. He denies any left upper or lower extremity weakness to me.  He denies any current left upper or lower extremity numbness.  He denies speech abnormality, dizziness,headache, chest pain, dyspnea, abdominal pain, or fever   HPI     Past Medical History:  Diagnosis Date  . Arthritis   . COPD (chronic obstructive pulmonary disease) (HCC)    MINIMAL   . History of kidney stones     Patient Active Problem List   Diagnosis Date Noted  . 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 CATARACT EXTRACTION W/ INTRAOCULAR LENS IMPLANT     RIGHT  . 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 TONSILLECTOMY    . TOTAL SHOULDER ARTHROPLASTY Right 10/25/2014   Procedure: TOTAL SHOULDER ARTHROPLASTY;  Surgeon: Mable Paris, MD;  Location: [facility] OR;  Service: Orthopedics;  Laterality: Right;  Right total shoulder arthroplasty       No family history on file.  Social History   Tobacco Use  .  Smoking status: Former Smoker  Substance Use Topics  . Alcohol use: Yes    Comment: GLASS WINE OR DRINK DAILY  . Drug use: No    Home Medications Prior to Admission medications   Medication Sig Start Date End Date Taking? Authorizing Provider  aspirin 325 MG tablet Take 650 mg by mouth daily as needed for mild pain or moderate pain.    [provider]  docusate sodium (COLACE) 100 MG capsule Take 1 capsule (100 mg total) by mouth 3 (three) times daily as needed. 10/25/14   Jiles Harold, PA-C  DUREZOL 0.05 % EMUL Place 1 drop into the right eye 2 (two) times daily. 09/06/14   [provider]  montelukast (SINGULAIR) 10 MG tablet Take 1 tablet by mouth at bedtime. 09/25/14   [provider]  Multiple Vitamins-Minerals (MULTIVITAMIN WITH MINERALS) tablet Take 1 tablet by mouth daily.    [provider]  Omega-3 Fatty Acids (FISH OIL PO) Take 1 capsule by mouth daily.    [provider]  oxyCODONE-acetaminophen (ROXICET) 5-325 MG per tablet Take 1-2 tablets by mouth every 4 (four) hours as needed for severe pain. 10/25/14   Jiles Harold, PA-C  simvastatin (ZOCOR) 40 MG tablet Take 1 tablet by mouth daily. 08/28/14   [provider]    Allergies    Patient has no known allergies.  Review of Systems   Review of Systems  Constitutional: Negative for chills and fever.  Eyes: Positive for visual disturbance.  Respiratory: Negative for shortness of breath.   Cardiovascular: Negative for chest pain.  Gastrointestinal: Negative for abdominal pain and vomiting.  Genitourinary: Negative for dysuria.  Neurological: Negative for dizziness, syncope, weakness and headaches.       Positive for temporary LUE paresthesias.   Psychiatric/Behavioral: Positive for confusion.  All other systems reviewed and are negative.   Physical Exam Updated Vital Signs BP (!) 171/111 (BP Location: Right Arm)   Pulse 63   Temp 98.3 F (36.8 C)  (Oral)   Resp 15   SpO2 97%   Physical Exam Vitals and nursing note reviewed.  Constitutional:      General: He is not in acute distress.    Appearance: He is well-developed. He is not toxic-appearing.  HENT:     Head: Normocephalic and atraumatic.  Eyes:     General:        Right eye: No discharge.        Left eye: No discharge.     Extraocular Movements: Extraocular movements intact.     Conjunctiva/sclera: Conjunctivae normal.     Pupils: Pupils are equal, round, and reactive to light.  Cardiovascular:     Rate and Rhythm: Normal rate and regular rhythm.  Pulmonary:     Effort: Pulmonary effort is normal. No respiratory distress.     Breath sounds: Normal breath sounds. No wheezing, rhonchi or rales.  Abdominal:     General: There is no distension.     Palpations: Abdomen is soft.     Tenderness: There is no abdominal tenderness.  Musculoskeletal:     Cervical back: Neck supple.  Skin:    General: Skin is warm and dry.     Findings: No rash.  Neurological:     Mental Status: He is alert.     Comments: No significant dysarthria. CN III-XII Grossly intact with the exception of partial left homonymous hemianopsia. Sensation grossly intact x 4. 5/5 symmetric grip strength & ankle plantar/dorsiflexion bilaterally. No pronator or LE drift. Finger to nose without significant dysmetria. Alert & oriented x 3. A bit unsteady standing up, but able to ambulate a few steps without significant ataxia.   Psychiatric:        Behavior: Behavior normal.    ED Results / Procedures / Treatments   Labs (all labs ordered are listed, but only abnormal results are displayed) Labs Reviewed  CBC - Abnormal; Notable for the following components:      Result Value   RBC 3.90 (*)    MCV 104.9 (*)    MCH 35.1 (*)    All other components within normal limits  COMPREHENSIVE METABOLIC PANEL - Abnormal; Notable for the following components:   Glucose, Bld 130 (*)    BUN 24 (*)    All other  components within normal limits  PROTIME-INR  APTT  DIFFERENTIAL    EKG None  Radiology CT HEAD WO CONTRAST  Result Date: 06/16/2020 CLINICAL DATA:  Altered mental status, neurological deficit, suspected stroke EXAM: CT HEAD WITHOUT CONTRAST TECHNIQUE: Contiguous axial images were obtained from the base of the skull through the vertex without intravenous contrast. Sagittal and coronal MPR images reconstructed from axial data set. COMPARISON:  None FINDINGS: Brain: Generalized atrophy. Normal ventricular morphology. No midline shift or mass effect. Small vessel chronic ischemic changes of deep cerebral white matter. Subacute infarct identified at medial aspect of posterior RIGHT temporal lobe extending into RIGHT occipital lobe. No definite intracranial hemorrhage,  mass lesion, or extra-axial fluid collection. Vascular: High attenuation thrombus identified within RIGHT posterior cerebral artery branch extending to the infarcted segment. Atherosclerotic calcification of internal carotid arteries at skull base. Skull: Intact Sinuses/Orbits: Clear Other: N/A IMPRESSION: Atrophy with small vessel chronic ischemic changes of deep cerebral white matter. Subacute infarct at medial aspect of posterior RIGHT temporal lobe extending into RIGHT occipital lobe. High attenuation thrombus identified within RIGHT posterior cerebral artery branch extending to the infarcted segment. Findings called to Dr. Denton Lank on 06/16/2020 at 1143 hours. Electronically Signed   By: Ulyses Southward M.D.   On: 06/16/2020 11:44   MR BRAIN WO CONTRAST  Result Date: 06/16/2020 CLINICAL DATA:  Neuro deficit, acute, stroke suspected. EXAM: MRI HEAD WITHOUT CONTRAST TECHNIQUE: Multiplanar, multiecho pulse sequences of the brain and surrounding structures were obtained without intravenous contrast. COMPARISON:  Noncontrast head CT performed earlier the same day 06/16/2020. FINDINGS: Brain: Mild intermittent motion degradation. Mild generalized  parenchymal atrophy There is a large acute/early subacute right PCA territory infarct predominantly affecting the medial right temporal lobe and right occipital lobe. Corresponding T2/FLAIR hyperintensity at this site. No significant mass effect. Subtle SWI signal loss at the infarction site likely reflecting petechial hemorrhage Mild multifocal T2/FLAIR hyperintensity within the cerebral white matter is nonspecific, but consistent with chronic small vessel ischemic disease. Prominent perivascular space versus small chronic lacunar infarct within the right basal ganglia (series 6, image 15). Probable tiny chronic infarcts within the bilateral cerebellar hemispheres. No evidence of intracranial mass. No extra-axial fluid collection. Vascular: The right posterior cerebral artery is likely occluded. Otherwise, there are expected flow voids within the proximal large arterial vessels Skull and upper cervical spine: No focal marrow lesion Sinuses/Orbits: Visualized orbits show no acute finding. Prior right lens replacement. Minimal scattered paranasal sinus mucosal thickening. 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 Mild associated petechial hemorrhage. No significant mass effect. Background mild generalized parenchymal atrophy and cerebral white matter chronic small vessel ischemic disease. Prominent perivascular space versus small chronic lacunar infarct within the right basal ganglia. Probable tiny chronic infarcts within the bilateral cerebellar hemispheres. Electronically Signed   By: Jackey Loge DO   On: 06/16/2020 16:38    Procedures Procedures (including critical care time)  Medications Ordered in ED Medications  sodium chloride flush (NS) 0.9 % injection 3 mL (has no administration in time range)    ED Course  I have reviewed the triage vital signs and the  nursing notes.  Pertinent labs & imaging results that were available during my care of the patient were reviewed by me and considered in my medical decision making (see chart for details).    MDM Rules/Calculators/A&P                         Patient presents to the ED for evaluation of confusion, L eye visual field changes, and temporary LUE paresthesias with onset of sxs a couple of days ago. > 24 hours from onset therefore not TPA or LVO candidate. On exam partial left homonymous hemianopsia, no other significant deficits appreciated.   Additional history obtained:  Additional history obtained from chart review & nursing note reviw.  Lab Tests:  I reviewed & interpreted labs, which included:  CBC, CMP, PT/INR, APTT: Unremarkable.  COVID testing ordered.   Imaging Studies ordered:  Patient had a head CT with subsequent MRI ordered while  in the waiting room, I independently visualized and interpreted imaging which showed Large acute/early subacute right PCA territory infarction predominantly affecting the medial right temporal lobe and right occipital lobe. Mild associated petechial hemorrhage. No significant mass effect. Background mild generalized parenchymal atrophy and cerebral white matter chronic small vessel ischemic disease. Prominent perivascular space versus small chronic lacunar infarct within the right basal ganglia. Probable tiny chronic infarcts within the bilateral cerebellar hemispheres.  ED Course:  17:29: CONSULT: Discussed with neurologist Dr. Wilford Corner- recommends admission to hospitalist service, neuro team will see in consultation.   17:53: CONSULT: Discussed with family medicine residency service- accept admission.   Portions of this note were generated with Scientist, clinical (histocompatibility and immunogenetics). Dictation errors may occur despite best attempts at proofreading.  Final Clinical Impression(s) / ED Diagnoses Final diagnoses:  Cerebrovascular accident (CVA), unspecified mechanism [facility])     Rx / DC Orders ED Discharge Orders    None       Desmond Lope 06/16/20 Silvestre Gunner, MD 06/16/20 1928

## 2020-06-17 ENCOUNTER — Observation Stay (HOSPITAL_BASED_OUTPATIENT_CLINIC_OR_DEPARTMENT_OTHER): Payer: Medicare Other

## 2020-06-17 DIAGNOSIS — I639 Cerebral infarction, unspecified: Secondary | ICD-10-CM | POA: Diagnosis not present

## 2020-06-17 DIAGNOSIS — I6389 Other cerebral infarction: Secondary | ICD-10-CM

## 2020-06-17 DIAGNOSIS — I63431 Cerebral infarction due to embolism of right posterior cerebral artery: Secondary | ICD-10-CM | POA: Diagnosis not present

## 2020-06-17 LAB — ECHOCARDIOGRAM COMPLETE
AR max vel: 2.54 cm2
AV Area VTI: 2.13 cm2
AV Area mean vel: 2.29 cm2
AV Mean grad: 6 mmHg
AV Peak grad: 10.9 mmHg
Ao pk vel: 1.65 m/s
Area-P 1/2: 2.37 cm2
S' Lateral: 3.2 cm

## 2020-06-17 LAB — LIPID PANEL
Cholesterol: 246 mg/dL — ABNORMAL HIGH (ref 0–200)
HDL: 103 mg/dL (ref >40)
LDL Cholesterol: 113 mg/dL — ABNORMAL HIGH (ref 0–99)
Total CHOL/HDL Ratio: 2.4 RATIO
Triglycerides: 151 mg/dL — ABNORMAL HIGH (ref <150)
VLDL: 30 mg/dL (ref 0–40)

## 2020-06-17 LAB — BASIC METABOLIC PANEL
Anion gap: 10 (ref 5–15)
BUN: 22 mg/dL (ref 8–23)
CO2: 28 mmol/L (ref 22–32)
Calcium: 9.6 mg/dL (ref 8.9–10.3)
Chloride: 103 mmol/L (ref 98–111)
Creatinine, Ser: 1 mg/dL (ref 0.61–1.24)
GFR calc Af Amer: >60 mL/min (ref >60)
GFR calc non Af Amer: >60 mL/min (ref >60)
Glucose, Bld: 130 mg/dL — ABNORMAL HIGH (ref 70–99)
Potassium: 4.1 mmol/L (ref 3.5–5.1)
Sodium: 141 mmol/L (ref 135–145)

## 2020-06-17 LAB — HEMOGLOBIN A1C
Hgb A1c MFr Bld: 6.3 % — ABNORMAL HIGH (ref 4.8–5.6)
Mean Plasma Glucose: 134.11 mg/dL

## 2020-06-17 LAB — CBC
HCT: 38.9 % — ABNORMAL LOW (ref 39.0–52.0)
Hemoglobin: 12.9 g/dL — ABNORMAL LOW (ref 13.0–17.0)
MCH: 34.6 pg — ABNORMAL HIGH (ref 26.0–34.0)
MCHC: 33.2 g/dL (ref 30.0–36.0)
MCV: 104.3 fL — ABNORMAL HIGH (ref 80.0–100.0)
Platelets: 197 10*3/uL (ref 150–400)
RBC: 3.73 MIL/uL — ABNORMAL LOW (ref 4.22–5.81)
RDW: 12.6 % (ref 11.5–15.5)
WBC: 8.2 10*3/uL (ref 4.0–10.5)
nRBC: 0 % (ref 0.0–0.2)

## 2020-06-17 MED ORDER — ASPIRIN EC 81 MG PO TBEC: 81.0000 mg | DELAYED_RELEASE_TABLET | Freq: Every day | ORAL | 0 refills | Status: AC

## 2020-06-17 MED ORDER — IRBESARTAN 150 MG PO TABS
150.0000 mg | ORAL_TABLET | Freq: Every day | ORAL | Status: DC
  Administered 2020-06-17: 150 mg via ORAL
  Filled 2020-06-17: qty 1

## 2020-06-17 MED ORDER — ATORVASTATIN CALCIUM 80 MG PO TABS: 80.0000 mg | ORAL_TABLET | Freq: Every day | ORAL | 0 refills | Status: AC

## 2020-06-17 MED ORDER — CLOPIDOGREL BISULFATE 75 MG PO TABS: 75.0000 mg | ORAL_TABLET | Freq: Every day | ORAL | 0 refills | Status: DC

## 2020-06-17 NOTE — Discharge Instructions (Signed)
It was great to meet you Eric Brewer.  Thank you for letting us be a part of your care. You were admitted to the hospital for a stroke. You were started on a high intensity statin medication to prevent future strokes, as well as Aspirin 81 mg and Plavix 75 mg.  You will also be given a 30 day monitor as we are not certain of the original source of the blood clot that caused the stroke.  If you have new symptoms such as difficulty speaking, trouble moving, change in sensation (change in feeling) of one part of your body, confusion, or loss of consciousness, please return to the emergency department.   Follow Up: -with PCP in near future - Neurology in 4 weeks, they will call you - Outpatient Physical Therapy/Occupatient Therapy  Prescriptions:  - Atorvastatin 80 mg daily - Aspirin 81 mg daily - Plavix 75 mg for 3 weeks

## 2020-06-17 NOTE — Progress Notes (Signed)
STROKE TEAM PROGRESS NOTE   INTERVAL HISTORY Patient currently in the ED. he presented with 3-day history of left-sided paresthesias of the left confusion and some vision difficulties.  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  2D echo is pending.  LDL cholesterol is mg percent and A1c is 6.3.  Vitals:   06/17/20 1000 06/17/20 1400 06/17/20 1415 06/17/20 1500  BP: (!) 168/86     Pulse: 68 71 70 73  Resp: (!) 21  Temp:      TempSrc:      SpO2: 95% 95% 94% 95%   CBC:  Recent Labs  Lab 06/16/20 1111 06/17/20 0407  WBC 7.2 8.2  NEUTROABS 5.7  --   HGB 13.7 12.9*  HCT 40.9 38.9*  MCV 104.9* 104.3*  PLT 217 197   Basic Metabolic Panel:  Recent Labs  Lab 06/16/20 1111 06/17/20 0407  NA 143 141  K 4.8 4.1  CL 105 103  CO2 27 28  GLUCOSE 130* 130*  BUN 24* 22  CREATININE 1.13 1.00  CALCIUM 10.0 9.6   Lipid Panel:  Recent Labs  Lab 06/17/20 0407  CHOL 246*  TRIG 151*  HDL 103  CHOLHDL 2.4  VLDL 30  LDLCALC 147*   HgbA1c:  Recent Labs  Lab 06/17/20 0407  HGBA1C 6.3*   Urine Drug Screen: No results for input(s): LABOPIA, COCAINSCRNUR, LABBENZ, AMPHETMU, THCU, LABBARB in the last 168 hours.  Alcohol Level No results for input(s): ETH in the last 168 hours.  IMAGING past 24 hours CT ANGIO HEAD W OR WO CONTRAST  Result Date: 06/16/2020 CLINICAL DATA:  Stroke/TIA, assess intracranial arteries; stroke/TIA, assess extracranial arteries. Stroke, follow-up. EXAM: CT ANGIOGRAPHY HEAD AND NECK TECHNIQUE: Multidetector CT imaging of the head and neck was performed using the standard protocol during bolus administration of intravenous contrast. Multiplanar CT image reconstructions and MIPs were obtained to evaluate the vascular anatomy. Carotid stenosis measurements (when applicable) are obtained utilizing NASCET criteria, using the distal internal carotid diameter as the  denominator. CONTRAST:  75mL OMNIPAQUE IOHEXOL 350 MG/ML SOLN COMPARISON:  Brain MRI performed earlier the same day 06/16/2020, noncontrast head CT 06/16/2020. FINDINGS: CTA NECK FINDINGS Aortic arch: Standard aortic branching. Atherosclerotic plaque within the visualized aortic arch and proximal major branch vessels of the neck. No hemodynamically significant innominate or proximal subclavian artery stenosis. Right carotid system: CCA and ICA patent within the neck without significant stenosis (50% or greater). Mild to moderate calcified plaque within the carotid bifurcation and proximal ICA Left carotid system: CCA and ICA patent within the neck without significant stenosis (50% or greater). Mild to moderate calcified plaque within the carotid bifurcation and proximal ICA. Vertebral arteries: Codominant and patent within the neck bilaterally without stenosis. Mild nonstenotic calcified plaque at the origin of the left vertebral artery. Skeleton: No acute bony abnormality or aggressive osseous lesion. Cervical spondylosis. Most notably at C5-C6 and C6-C7, there is advanced disc space narrowing with degenerative endplate irregularity and degenerative endplate sclerosis as well as shallow posterior disc osteophyte complexes and uncovertebral hypertrophy. Other neck: No neck mass or cervical lymphadenopathy. Thyroid unremarkable. Upper chest: No consolidation within the imaged lung apices. Review of the MIP images confirms the above findings CTA HEAD FINDINGS Anterior circulation: The intracranial internal carotid arteries are patent. Mild calcified plaque within both vessels without stenosis. The M1 middle cerebral arteries are patent without significant stenosis.  No M2 proximal branch occlusion or high-grade proximal stenosis is identified. The anterior cerebral arteries are patent. No intracranial aneurysm is identified. Posterior circulation: The intracranial vertebral arteries are patent. The basilar artery is  patent. There is a hypoplastic right P1 segment with large right posterior communicating artery. The right posterior cerebral artery is occluded from the proximal P2 segment. The left posterior communicating artery is hypoplastic or absent. Venous sinuses: Within limitations of contrast timing, no convincing thrombus. Anatomic variants: As described Review of the MIP images confirms the above findings IMPRESSION: CTA neck: 1. The bilateral common and internal carotid arteries are patent within the neck without hemodynamically significant stenosis. Mild-to-moderate calcified plaque within the carotid bifurcations and proximal internal carotid arteries bilaterally. 2. The vertebral arteries are codominant and patent within the neck bilaterally without stenosis. CTA head: Hypoplastic right P1 segment with large right posterior communicating artery. The right posterior cerebral artery is occluded from the proximal P2 segment. Electronically Signed   By: Jackey LogeKyle  Golden DO   On: 06/16/2020 19:38   CT ANGIO NECK W OR WO CONTRAST  Result Date: 06/16/2020 CLINICAL DATA:  Stroke/TIA, assess intracranial arteries; stroke/TIA, assess extracranial arteries. Stroke, follow-up. EXAM: CT ANGIOGRAPHY HEAD AND NECK TECHNIQUE: Multidetector CT imaging of the head and neck was performed using the standard protocol during bolus administration of intravenous contrast. Multiplanar CT image reconstructions and MIPs were obtained to evaluate the vascular anatomy. Carotid stenosis measurements (when applicable) are obtained utilizing NASCET criteria, using the distal internal carotid diameter as the denominator. CONTRAST:  75mL OMNIPAQUE IOHEXOL 350 MG/ML SOLN COMPARISON:  Brain MRI performed earlier the same day 06/16/2020, noncontrast head CT 06/16/2020. FINDINGS: CTA NECK FINDINGS Aortic arch: Standard aortic branching. Atherosclerotic plaque within the visualized aortic arch and proximal major branch vessels of the neck. No  hemodynamically significant innominate or proximal subclavian artery stenosis. Right carotid system: CCA and ICA patent within the neck without significant stenosis (50% or greater). Mild to moderate calcified plaque within the carotid bifurcation and proximal ICA Left carotid system: CCA and ICA patent within the neck without significant stenosis (50% or greater). Mild to moderate calcified plaque within the carotid bifurcation and proximal ICA. Vertebral arteries: Codominant and patent within the neck bilaterally without stenosis. Mild nonstenotic calcified plaque at the origin of the left vertebral artery. Skeleton: No acute bony abnormality or aggressive osseous lesion. Cervical spondylosis. Most notably at C5-C6 and C6-C7, there is advanced disc space narrowing with degenerative endplate irregularity and degenerative endplate sclerosis as well as shallow posterior disc osteophyte complexes and uncovertebral hypertrophy. Other neck: No neck mass or cervical lymphadenopathy. Thyroid unremarkable. Upper chest: No consolidation within the imaged lung apices. Review of the MIP images confirms the above findings CTA HEAD FINDINGS Anterior circulation: The intracranial internal carotid arteries are patent. Mild calcified plaque within both vessels without stenosis. The M1 middle cerebral arteries are patent without significant stenosis. No M2 proximal branch occlusion or high-grade proximal stenosis is identified. The anterior cerebral arteries are patent. No intracranial aneurysm is identified. Posterior circulation: The intracranial vertebral arteries are patent. The basilar artery is patent. There is a hypoplastic right P1 segment with large right posterior communicating artery. The right posterior cerebral artery is occluded from the proximal P2 segment. The left posterior communicating artery is hypoplastic or absent. Venous sinuses: Within limitations of contrast timing, no convincing thrombus. Anatomic  variants: As described Review of the MIP images confirms the above findings IMPRESSION: CTA neck: 1. The bilateral common and internal  carotid arteries are patent within the neck without hemodynamically significant stenosis. Mild-to-moderate calcified plaque within the carotid bifurcations and proximal internal carotid arteries bilaterally. 2. The vertebral arteries are codominant and patent within the neck bilaterally without stenosis. CTA head: Hypoplastic right P1 segment with large right posterior communicating artery. The right posterior cerebral artery is occluded from the proximal P2 segment. Electronically Signed   By: Jackey Loge DO   On: 06/16/2020 19:38   ECHOCARDIOGRAM COMPLETE  Result Date: 06/17/2020    ECHOCARDIOGRAM REPORT   Patient Name:   Eric Brewer Date of Exam: 06/17/2020 Medical Rec #:  [id number]       Height:       69.0 in Accession #:    [id number]      Weight:       198.4 lb Date of Birth:  04/21/1941       BSA:          2.059 m Patient Age:    78 years        BP:           172/78 mmHg Patient Gender: M               HR:           62 bpm. Exam Location:  Inpatient Procedure: 2D Echo, Cardiac Doppler and Color Doppler Indications:    Stroke  History:        Patient has no prior history of Echocardiogram examinations.                 COPD; Risk Factors:Hypertension, Dyslipidemia and Former Smoker.  Sonographer:    Ross Ludwig RDCS (AE) Referring Phys: [phone] [facility]  Sonographer Comments: Suboptimal parasternal window. IMPRESSIONS  1. Left ventricular ejection fraction, by estimation, is 55 to 60%. The left ventricle has normal function. The left ventricle has no regional wall motion abnormalities. There is moderate left ventricular hypertrophy. Left ventricular diastolic parameters are consistent with Grade I diastolic dysfunction (impaired relaxation).  2. Right ventricular systolic function is normal. The right ventricular size is normal.  3. The mitral valve is abnormal.  Trivial mitral valve regurgitation.  4. The aortic valve is tricuspid. Aortic valve regurgitation is not visualized. Mild to moderate aortic valve sclerosis/calcification is present, without any evidence of aortic stenosis.  5. The inferior vena cava is normal in size with greater than 50% respiratory variability, suggesting right atrial pressure of 3 mmHg. FINDINGS  Left Ventricle: Left ventricular ejection fraction, by estimation, is 55 to 60%. The left ventricle has normal function. The left ventricle has no regional wall motion abnormalities. The left ventricular internal cavity size was normal in size. There is  moderate left ventricular hypertrophy. Left ventricular diastolic parameters are consistent with Grade I diastolic dysfunction (impaired relaxation). Indeterminate filling pressures. Right Ventricle: The right ventricular size is normal. No increase in right ventricular wall thickness. Right ventricular systolic function is normal. Left Atrium: Left atrial size was normal in size. Right Atrium: Right atrial size was normal in size. Pericardium: There is no evidence of pericardial effusion. Mitral Valve: The mitral valve is abnormal. There is mild thickening of the mitral valve leaflet(s). Mild mitral annular calcification. Trivial mitral valve regurgitation. MV peak gradient, 6.2 mmHg. The mean mitral valve gradient is 1.0 mmHg. Tricuspid Valve: The tricuspid valve is not well visualized. Tricuspid valve regurgitation is not demonstrated. Aortic Valve: The aortic valve is tricuspid. Aortic valve regurgitation is not visualized. Mild to moderate aortic  valve sclerosis/calcification is present, without any evidence of aortic stenosis. Aortic valve mean gradient measures 6.0 mmHg. Aortic valve peak gradient measures 10.9 mmHg. Aortic valve area, by VTI measures 2.13 cm. Pulmonic Valve: The pulmonic valve was not well visualized. Pulmonic valve regurgitation is not visualized. Aorta: The aortic root and  ascending aorta are structurally normal, with no evidence of dilitation. Venous: The inferior vena cava is normal in size with greater than 50% respiratory variability, suggesting right atrial pressure of 3 mmHg. IAS/Shunts: The interatrial septum was not well visualized.  LEFT VENTRICLE PLAX 2D LVIDd:         4.20 cm  Diastology LVIDs:         3.20 cm  LV e' medial:    8.27 cm/s LV PW:         1.40 cm  LV E/e' medial:  7.5 LV IVS:        1.30 cm  LV e' lateral:   7.51 cm/s LVOT diam:     1.80 cm  LV E/e' lateral: 8.2 LV SV:         74 LV SV Index:   36 LVOT Area:     2.54 cm  RIGHT VENTRICLE             IVC RV Basal diam:  3.00 cm     IVC diam: 1.40 cm RV S prime:     16.30 cm/s TAPSE (M-mode): 3.0 cm LEFT ATRIUM             Index       RIGHT ATRIUM           Index LA diam:        3.90 cm 1.89 cm/m  RA Area:     21.80 cm LA Vol (A2C):   51.3 ml 24.91 ml/m RA Volume:   56.20 ml  27.29 ml/m LA Vol (A4C):   57.5 ml 27.93 ml/m LA Biplane Vol: 54.3 ml 26.37 ml/m  AORTIC VALVE AV Area (Vmax):    2.54 cm AV Area (Vmean):   2.29 cm AV Area (VTI):     2.13 cm AV Vmax:           165.00 cm/s AV Vmean:          119.000 cm/s AV VTI:            0.349 m AV Peak Grad:      10.9 mmHg AV Mean Grad:      6.0 mmHg LVOT Vmax:         165.00 cm/s LVOT Vmean:        107.000 cm/s LVOT VTI:          0.292 m LVOT/AV VTI ratio: 0.84  AORTA Ao Root diam: 3.50 cm MITRAL VALVE MV Area (PHT): 2.37 cm     SHUNTS MV Peak grad:  6.2 mmHg     Systemic VTI:  0.29 m MV Mean grad:  1.0 mmHg     Systemic Diam: 1.80 cm MV Vmax:       1.24 m/s MV Vmean:      51.1 cm/s MV Decel Time: 320 msec MV E velocity: 61.80 cm/s MV A velocity: 119.00 cm/s MV E/A ratio:  0.52 Zoila Shutter MD Electronically signed by Zoila Shutter MD Signature Date/Time: 06/17/2020/11:37:02 AM    Final     PHYSICAL EXAM Obese elderly Caucasian male not in distress. . Afebrile. Head is nontraumatic. Neck is supple without bruit.    Cardiac exam  no murmur or gallop. Lungs  are clear to auscultation. Distal pulses are well felt. Neurological Exam ;  Awake  Alert oriented x 3. Normal speech and language.diminished recall 2/3.  Eye movements full without nystagmus.fundi were not visualized. Vision acuity appears normal.  Dense left homonymous hemianopsia.  Hearing is normal. Palatal movements are normal. Face symmetric. Tongue midline. Normal strength, tone, reflexes and coordination. Normal sensation. Gait deferred.  ASSESSMENT/PLAN Eric Brewer is a 79 y.o. 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 R PCA thrombus extending into infarct. Small vessel disease. Atrophy.   MRI  Large R PCA territory infarct w/ mild petechial hemorrhage. Small vessel disease. Atrophy. Possible R basal ganglia chronic lacune vs vascular space. Probable tiny B cerebellar infarcts.  CTA head hypoplastic R P1 w/ large R PCom. R PCA occlusion from proximal P2.  CTA neck mild to moderate plaque B ICA bifurcations and proximal   2D Echo EF 55-60%. No source of embolus   LDL 113  HgbA1c 6.3  VTE prophylaxis - SCDs   states he does not take aspirin prior to admission prior to admission, now on No antithrombotic. 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 Discussed w/ FP team  Hypertension  Stable . Permissive hypertension (OK if < 220/120) but gradually normalize in 5-7 days . Long-term BP goal normotensive  Hyperlipidemia  Home meds:  zocor 40 and fish oil  Now on lipitor 80  LDL 113, goal < 70  Continue  statin at discharge  Other Stroke Risk Factors  Advanced age  Former Cigarette smoker  ETOH use (2 glasses wine and 1 martini a night), advised to drink no more than 2 drink(s) a day  Obesity, There is no height or weight on file to calculate BMI., recommend weight loss, diet and exercise as appropriate   Other Active Problems  Baseline short term memory deficits, not evaluated  COPD stable  Hospital day # 0  He presented with embolic right PCA infarct with strong suspicion for paroxysmal A. fib or cardiac source of embolism.  Recommend aspirin Plavix for 3 weeks followed by aspirin alone.  Check 2D echo.  May need loop recorder at discharge to look for paroxysmal A. fib.  Aggressive risk factor modification.  Patient advised not to drive peripheral vision improves.  Long discussion with patient and answered questions.  Greater than 50% time during this 35-minute visit was spent in counseling and coordination of care about his embolic stroke and answering questions Delia Heady, MD To contact Stroke Continuity provider, please refer to [email or web address]. After hours, contact General Neurology

## 2020-06-17 NOTE — Evaluation (Addendum)
Occupational Therapy Evaluation Patient Details Name: Eric Brewer MRN: [id number] DOB: [birth year] Today's Date: 06/17/2020    History of Present Illness Pt is an 79 y/o male admitted secondary to increased confusion and L hand numbness. MRI showed R PCA infarct. PMH includes COPD and HTN.    Clinical Impression   This 79 y/o male presents with the above. PTA pt reports being independent with ADL, iADL and mobility tasks, living with spouse. Pt currently presenting with the above and below listed deficits, most notable being impaired cognition and visual deficits. Pt having difficulty processing/following instruction during formal visual assessment but has L peripheral deficits noted. Pt performing mobility and ADL tasks grossly at minguard assist level without AD at this time. Administered Short Blessed Test during session with pt receiving score of 11 (indicative of "impairment consistent with dementia"). Pt to benefit from further assessment though most notable were STM deficits today which pt endorses having some memory difficulties at baseline. Pt to benefit from continued acute OT services and currently recommend outpatient neuro OT services to further address vision, cognition, and to maximize his overall safety and independence with ADL and mobility. Will follow.     Follow Up Recommendations  Outpatient OT;Supervision/Assistance - 24 hour (outpt neuro OT; 24hr initially )    Equipment Recommendations  None recommended by OT           Precautions / Restrictions Precautions Precautions: Fall Restrictions Weight Bearing Restrictions: No      Mobility Bed Mobility Overal bed mobility: Needs Assistance Bed Mobility: Supine to Sit;Sit to Supine     Supine to sit: Supervision Sit to supine: Supervision   General bed mobility comments: Supervision for safety.   Transfers Overall transfer level: Needs assistance Equipment used: None Transfers: Sit to/from Stand Sit to  Stand: Min guard         General transfer comment: Min guard for safety. Pt asymptomatic upon standing.     Balance Overall balance assessment: Needs assistance Sitting-balance support: No upper extremity supported;Feet supported Sitting balance-Leahy Scale: Good     Standing balance support: No upper extremity supported Standing balance-Leahy Scale: Fair Standing balance comment: pt able to spin in circle without LOB (doing so on his own accord given distracted by lines)                            ADL either performed or assessed with clinical judgement   ADL Overall ADL's : Needs assistance/impaired Eating/Feeding: Modified independent;Sitting   Grooming: Set up;Sitting   Upper Body Bathing: Set up;Sitting   Lower Body Bathing: Min guard;Sit to/from stand   Upper Body Dressing : Modified independent;Sitting   Lower Body Dressing: Min guard;Sit to/from stand   Toilet Transfer: Min Manufacturing systems engineer Details (indicate cue type and reason): simulated via transfer to/from EOB, limited given lines in ED room Toileting- Architect and Hygiene: Min guard;Sit to/from stand;Sitting/lateral lean       Functional mobility during ADLs: Min guard       Vision Baseline Vision/History: Wears glasses Wears Glasses: At all times Patient Visual Report: Peripheral vision impairment (L side, initially) Vision Assessment?: Yes Eye Alignment: Within Functional Limits Ocular Range of Motion: Within Functional Limits Alignment/Gaze Preference: Within Defined Limits Tracking/Visual Pursuits: Decreased smoothness of vertical tracking;Unable to hold eye position out of midline;Impaired - to be further tested in functional context Visual Fields: Left visual field deficit;Impaired-to be further tested in functional context (L peripheral  field ) Additional Comments: deficits noted in L peripheral/visual field - difficult to fully pinpoint deficits as pt  having difficulty following instruction for formal assessment      Perception     Praxis      Pertinent Vitals/Pain Pain Assessment: No/denies pain     Hand Dominance Right   Extremity/Trunk Assessment Upper Extremity Assessment Upper Extremity Assessment: RUE deficits/detail RUE Deficits / Details: decreased fine motor/opposition in relation to LUE, grossly symmetrical strength, pt denies decrease in sensation RUE Coordination: decreased fine motor   Lower Extremity Assessment Lower Extremity Assessment: Defer to PT evaluation   Cervical / Trunk Assessment Cervical / Trunk Assessment: Normal   Communication Communication Communication: No difficulties   Cognition Arousal/Alertness: Awake/alert Behavior During Therapy: WFL for tasks assessed/performed Overall Cognitive Status: No family/caregiver present to determine baseline cognitive functioning Area of Impairment: Attention;Memory;Problem solving                   Current Attention Level: Selective Memory: Decreased short-term memory       Problem Solving: Slow processing;Difficulty sequencing;Requires verbal cues General Comments: pt endorses some memory deficits at baseline, noted difficulty with processing instruction, administered Short Blessed Test with deficits most notable with STM   General Comments  educated in signs/symptoms of stroke including BEFAST acronym during session    Exercises     Shoulder Instructions      Home Living Family/patient expects to be discharged to:: Private residence Living Arrangements: Spouse/significant other Available Help at Discharge: Family;Available 24 hours/day Type of Home: House Home Access: Stairs to enter [employer] of Steps: 2 Entrance Stairs-Rails: None Home Layout: Two level Alternate Level Stairs-Number of Steps: flight Alternate Level Stairs-Rails: Left;Right Bathroom Shower/Tub: Producer, television/film/video: Handicapped height      Home Equipment: None          Prior Functioning/Environment Level of Independence: Independent                 OT Problem List: Impaired vision/perception;Decreased cognition;Decreased safety awareness;Impaired UE functional use      OT Treatment/Interventions: Self-care/ADL training;Therapeutic exercise;Energy conservation;DME and/or AE instruction;Neuromuscular education;Therapeutic activities;Cognitive remediation/compensation;Visual/perceptual remediation/compensation;Patient/family education;Balance training    OT Goals(Current goals can be found in the care plan section) Acute Rehab OT Goals Patient Stated Goal: to go home OT Goal Formulation: With patient Time For Goal Achievement: 07/01/20 Potential to Achieve Goals: Good  OT Frequency: Min 2X/week   Barriers to D/C:            Co-evaluation              AM-PAC OT "6 Clicks" Daily Activity     Outcome Measure Help from another person eating meals?: None Help from another person taking care of personal grooming?: A Little Help from another person toileting, which includes using toliet, bedpan, or urinal?: A Little Help from another person bathing (including washing, rinsing, drying)?: A Little Help from another person to put on and taking off regular upper body clothing?: None Help from another person to put on and taking off regular lower body clothing?: A Little 6 Click Score: 20   End of Session Nurse Communication: Mobility status  Activity Tolerance: Patient tolerated treatment well Patient left: in bed;with call bell/phone within reach  OT Visit Diagnosis: Other symptoms and signs involving cognitive function;Other symptoms and signs involving the nervous system (Z48.270)                Time: 7867-5449 OT Time  Calculation (min): 26 min Charges:  OT General Charges $OT Visit: 1 Visit OT Evaluation $OT Eval Moderate Complexity: 1 Mod OT Treatments $Self Care/Home Management : 8-22  mins  Marcy Siren, OT Acute Rehabilitation Services Pager [phone] Office [phone]   Eric Brewer 06/17/2020, 3:32 PM

## 2020-06-17 NOTE — Progress Notes (Signed)
  Echocardiogram 2D Echocardiogram has been performed.  Gerda Diss 06/17/2020, 8:51 AM

## 2020-06-17 NOTE — Progress Notes (Signed)
Family Medicine Teaching Service Daily Progress Note Intern Pager: [phone]  Patient name: Eric Brewer Medical record number: [id number] Date of birth: 01-03-1941 Age: 79 y.o. Gender: male  Primary Care Provider: Patient, No Pcp Per Consultants: Neuro Code Status: Full  Pt Overview and Major Events to Date:  Admitted 9/12  Assessment and Plan: Eric Brewer is a 79 y.o. 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  PMH is significant for HTN, HLD, former smoker, and COPD.  Right sided CVA Neuro following.  PT/OT to see today.  CTA showed no sign of Carotid Stenosis or blockage.  No weakness or deficits outside of visual deficit on Physical Exam.  Obtaining Echo for source of PE. - PT consult, OT consult - Telemetry monitoring - Frequent neuro checks - Hold off Lovenox therapy  - Regular diet -Echo pending   COPD Followed by pulmonologist Dr Lottie Rater, last seen on 03/21/20. Home meds: Prednisone 5 mg qd - continue Prendisone 5mg  QD  Hypertension SBP between 140-170 Home meds: Olmesartan 20mg  QD. - hold home meds for permissive HTN for 24-48 hours  Hyperlipidemia Cholesterol-246, HDL-103, LDL-113, Trig-151 - Continue Atorvastatin 80 mg   FEN/GI: Heart Healthy Diet PPx: SCD's, Ambulation when able   Status is: Observation  The patient remains OBS appropriate and will d/c before 2 midnights.  Dispo: The patient is from: Home              Anticipated d/c is to: Home              Anticipated d/c date is: 1 day              Patient currently is not medically stable to d/c.        Subjective:  Patient indicates he is feeling well today.  Denies any new symptoms such as weakness or numbeness or tingling anywhere.  Apeears to be mentating normally.  Objective: Temp:  [98.1 F (36.7 C)-98.4 F (36.9 C)] 98.3 F (36.8 C) (09/12 1320) Pulse Rate:  [56-72] 56 (09/13 0630) Resp:  [11-22]  19 (09/13 0630) BP: (144-196)/(66-111) 160/78 (09/13 0630) SpO2:  [86 %-99 %] 86 % (09/13 0630)  Physical Exam: Physical Exam Constitutional:      General: He is not in acute distress.    Appearance: Normal appearance. He is not ill-appearing.  HENT:     Head: Normocephalic and atraumatic.     Mouth/Throat:     Mouth: Mucous membranes are moist.  Cardiovascular:     Rate and Rhythm: Normal rate and regular rhythm.     Pulses: Normal pulses.  Pulmonary:     Effort: Pulmonary effort is normal.     Breath sounds: Normal breath sounds.  Abdominal:     General: Abdomen is flat. There is no distension.  Musculoskeletal:        General: Normal range of motion.  Skin:    General: Skin is warm.     Capillary Refill: Capillary refill takes less than 2 seconds.  Neurological:     General: No focal deficit present.     Mental Status: He is alert and oriented to person, place, and time.     Motor: No weakness.     Coordination: Coordination normal.     Gait: Gait normal.  Psychiatric:        Mood and Affect: Mood normal.        Behavior: Behavior normal.  Laboratory: Recent Labs  Lab 06/16/20 1111 06/17/20 0407  WBC 7.2 8.2  HGB 13.7 12.9*  HCT 40.9 38.9*  PLT 217 197   Recent Labs  Lab 06/16/20 1111 06/17/20 0407  NA 143 141  K 4.8 4.1  CL 105 103  CO2 27 28  BUN 24* 22  CREATININE 1.13 1.00  CALCIUM 10.0 9.6  PROT 7.0  --   BILITOT 0.8  --   ALKPHOS 42  --   ALT 19  --   AST 28  --   GLUCOSE 130* 130*     Imaging/Diagnostic Tests:  EXAM: CT HEAD WITHOUT CONTRAST  TECHNIQUE: Contiguous axial images were obtained from the base of the skull through the vertex without intravenous contrast. Sagittal and coronal MPR images reconstructed from axial data set.  COMPARISON: None  FINDINGS: Brain: Generalized atrophy. Normal ventricular morphology. No midline shift or mass effect. Small vessel chronic ischemic changes of deep cerebral white  matter. Subacute infarct identified at medial aspect of posterior RIGHT temporal lobe extending into RIGHT occipital lobe. No definite intracranial hemorrhage, mass lesion, or extra-axial fluid collection.  Vascular: High attenuation thrombus identified within RIGHT posterior cerebral artery branch extending to the infarcted segment. Atherosclerotic calcification of internal carotid arteries at skull base.  Skull: Intact  Sinuses/Orbits: Clear  Other: N/A  IMPRESSION: Atrophy with small vessel chronic ischemic changes of deep cerebral white matter.  Subacute infarct at medial aspect of posterior RIGHT temporal lobe extending into RIGHT occipital lobe.  High attenuation thrombus identified within RIGHT posterior cerebral artery branch extending to the infarcted segment.  Findings called to Dr. Denton Lank on 06/16/2020 at 1143 hours.   Electronically Signed By: Ulyses Southward M.D. On: 06/16/2020 11:44  EXAM: MRI HEAD WITHOUT CONTRAST  TECHNIQUE: Multiplanar, multiecho pulse sequences of the brain and surrounding structures were obtained without intravenous contrast.  COMPARISON: Noncontrast head CT performed earlier the same day 06/16/2020.  FINDINGS: Brain:  Mild intermittent motion degradation.  Mild generalized parenchymal atrophy  There is a large acute/early subacute right PCA territory infarct predominantly affecting the medial right temporal lobe and right occipital lobe. Corresponding T2/FLAIR hyperintensity at this site. No significant mass effect. Subtle SWI signal loss at the infarction site likely reflecting petechial hemorrhage  Mild multifocal T2/FLAIR hyperintensity within the cerebral white matter is nonspecific, but consistent with chronic small vessel ischemic disease.  Prominent perivascular space versus small chronic lacunar infarct within the right basal ganglia (series 6, image 15).  Probable tiny chronic infarcts  within the bilateral cerebellar hemispheres.  No evidence of intracranial mass.  No extra-axial fluid collection.  Vascular: The right posterior cerebral artery is likely occluded. Otherwise, there are expected flow voids within the proximal large arterial vessels  Skull and upper cervical spine: No focal marrow lesion  Sinuses/Orbits: Visualized orbits show no acute finding. Prior right lens replacement. Minimal scattered paranasal sinus mucosal thickening. No significant mastoid effusion  IMPRESSION: Large acute/early subacute right PCA territory infarction predominantly affecting the medial right temporal lobe and right occipital lobe. Mild associated petechial hemorrhage. No significant mass effect.  Background mild generalized parenchymal atrophy and cerebral white matter chronic small vessel ischemic disease.  Prominent perivascular space versus small chronic lacunar infarct within the right basal ganglia.  Probable tiny chronic infarcts within the bilateral cerebellar hemispheres.   Electronically Signed By: Jackey Loge DO On: 06/16/2020 16:38  EXAM: CT ANGIOGRAPHY HEAD AND NECK  TECHNIQUE: Multidetector CT imaging of the head and neck was performed using the standard  protocol during bolus administration of intravenous contrast. Multiplanar CT image reconstructions and MIPs were obtained to evaluate the vascular anatomy. Carotid stenosis measurements (when applicable) are obtained utilizing NASCET criteria, using the distal internal carotid diameter as the denominator.  CONTRAST:  60mL OMNIPAQUE IOHEXOL 350 MG/ML SOLN  COMPARISON:  Brain MRI performed earlier the same day 06/16/2020, noncontrast head CT 06/16/2020.  FINDINGS: CTA NECK FINDINGS  Aortic arch: Standard aortic branching. Atherosclerotic plaque within the visualized aortic arch and proximal major branch vessels of the neck. No hemodynamically significant innominate or  proximal subclavian artery stenosis.  Right carotid system: CCA and ICA patent within the neck without significant stenosis (50% or greater). Mild to moderate calcified plaque within the carotid bifurcation and proximal ICA  Left carotid system: CCA and ICA patent within the neck without significant stenosis (50% or greater). Mild to moderate calcified plaque within the carotid bifurcation and proximal ICA.  Vertebral arteries: Codominant and patent within the neck bilaterally without stenosis. Mild nonstenotic calcified plaque at the origin of the left vertebral artery.  Skeleton: No acute bony abnormality or aggressive osseous lesion. Cervical spondylosis. Most notably at C5-C6 and C6-C7, there is advanced disc space narrowing with degenerative endplate irregularity and degenerative endplate sclerosis as well as shallow posterior disc osteophyte complexes and uncovertebral hypertrophy.  Other neck: No neck mass or cervical lymphadenopathy. Thyroid unremarkable.  Upper chest: No consolidation within the imaged lung apices.  Review of the MIP images confirms the above findings  CTA HEAD FINDINGS  Anterior circulation:  The intracranial internal carotid arteries are patent. Mild calcified plaque within both vessels without stenosis.  The M1 middle cerebral arteries are patent without significant stenosis. No M2 proximal branch occlusion or high-grade proximal stenosis is identified.  The anterior cerebral arteries are patent.  No intracranial aneurysm is identified.  Posterior circulation:  The intracranial vertebral arteries are patent. The basilar artery is patent. There is a hypoplastic right P1 segment with large right posterior communicating artery. The right posterior cerebral artery is occluded from the proximal P2 segment. The left posterior communicating artery is hypoplastic or absent.  Venous sinuses: Within limitations of contrast timing,  no convincing thrombus.  Anatomic variants: As described  Review of the MIP images confirms the above findings  IMPRESSION: CTA neck:  1. The bilateral common and internal carotid arteries are patent within the neck without hemodynamically significant stenosis. Mild-to-moderate calcified plaque within the carotid bifurcations and proximal internal carotid arteries bilaterally. 2. The vertebral arteries are codominant and patent within the neck bilaterally without stenosis.  CTA head:  Hypoplastic right P1 segment with large right posterior communicating artery. The right posterior cerebral artery is occluded from the proximal P2 segment.   Electronically Signed   By: Jackey Loge DO   On: 06/16/2020 19:38  Jovita Kussmaul, MD 06/17/2020, 8:13 AM PGY-1, [facility] Family Medicine FPTS Intern pager: [phone], text pages welcome

## 2020-06-17 NOTE — Evaluation (Signed)
Physical Therapy Evaluation Patient Details Name: Eric Brewer MRN: [id number] DOB: [birth year] Today's Date: 06/17/2020   History of Present Illness  Pt is an 79 y/o male admitted secondary to increased confusion and L hand numbness. MRI showed R PCA infarct. PMH includes COPD and HTN.   Clinical Impression  Pt admitted secondary to problem above with deficits below. Pt requiring min guard A for mobility tasks. Did present with L sided peripheral vision deficits and mild instability. Feel pt would benefit from outpatient PT to work on balance deficits. Will continue to follow acutely to maximize functional mobility independence and safety.     Follow Up Recommendations Outpatient PT    Equipment Recommendations  None recommended by PT    Recommendations for Other Services       Precautions / Restrictions Precautions Precautions: Fall Restrictions Weight Bearing Restrictions: No      Mobility  Bed Mobility Overal bed mobility: Needs Assistance Bed Mobility: Supine to Sit;Sit to Supine     Supine to sit: Supervision Sit to supine: Supervision   General bed mobility comments: Supervision for safety.   Transfers Overall transfer level: Needs assistance Equipment used: None Transfers: Sit to/from Stand Sit to Stand: Min guard         General transfer comment: Min guard for safety. Pt asymptomatic upon standing.   Ambulation/Gait Ambulation/Gait assistance: Min guard Gait Distance (Feet): 25 Feet Assistive device: None Gait Pattern/deviations: Step-through pattern;Decreased stride length Gait velocity: Decreased   General Gait Details: No LOB noted with gait within the ED room. Min guard for safety. Pt reports mild dizziness, but did not limit mobility. Does report balance deficits at baseline.   Stairs            Wheelchair Mobility    Modified Rankin (Stroke Patients Only) Modified Rankin (Stroke Patients Only) Pre-Morbid Rankin Score: No  symptoms Modified Rankin: Moderately severe disability     Balance Overall balance assessment: Needs assistance Sitting-balance support: No upper extremity supported;Feet supported Sitting balance-Leahy Scale: Good     Standing balance support: No upper extremity supported Standing balance-Leahy Scale: Fair                               Pertinent Vitals/Pain Pain Assessment: No/denies pain    Home Living Family/patient expects to be discharged to:: Private residence Living Arrangements: Spouse/significant other Available Help at Discharge: Family;Available 24 hours/day Type of Home: House Home Access: Stairs to enter Entrance Stairs-Rails: None Entrance Stairs-Number of Steps: 2 Home Layout: Two level Home Equipment: None      Prior Function Level of Independence: Independent               Hand Dominance        Extremity/Trunk Assessment   Upper Extremity Assessment Upper Extremity Assessment: Defer to OT evaluation    Lower Extremity Assessment Lower Extremity Assessment: Generalized weakness    Cervical / Trunk Assessment Cervical / Trunk Assessment: Normal  Communication   Communication: No difficulties  Cognition Arousal/Alertness: Awake/alert Behavior During Therapy: WFL for tasks assessed/performed Overall Cognitive Status: No family/caregiver present to determine baseline cognitive functioning                                 General Comments: Was alert and oriented X4, however, demonstrated decreased awareness of deficits and somewhat slowed processing.  General Comments General comments (skin integrity, edema, etc.): No family present. Pt with L sided visual field deficits. Very concerned about not being able to drive.     Exercises     Assessment/Plan    PT Assessment Patient needs continued PT services  PT Problem List Decreased strength;Decreased balance;Decreased mobility;Decreased cognition;Decreased  knowledge of use of DME;Decreased safety awareness;Decreased knowledge of precautions       PT Treatment Interventions DME instruction;Gait training;Functional mobility training;Therapeutic activities;Therapeutic exercise;Stair training;Balance training;Patient/family education    PT Goals (Current goals can be found in the Care Plan section)  Acute Rehab PT Goals Patient Stated Goal: to go home PT Goal Formulation: With patient Time For Goal Achievement: 07/01/20 Potential to Achieve Goals: Good    Frequency Min 4X/week   Barriers to discharge        Co-evaluation               AM-PAC PT "6 Clicks" Mobility  Outcome Measure Help needed turning from your back to your side while in a flat bed without using bedrails?: None Help needed moving from lying on your back to sitting on the side of a flat bed without using bedrails?: None Help needed moving to and from a bed to a chair (including a wheelchair)?: A Little Help needed standing up from a chair using your arms (e.g., wheelchair or bedside chair)?: A Little Help needed to walk in hospital room?: A Little Help needed climbing 3-5 steps with a railing? : A Lot 6 Click Score: 19    End of Session Equipment Utilized During Treatment: Gait belt Activity Tolerance: Patient tolerated treatment well Patient left: in bed;with call bell/phone within reach (on stretcher in ED ) Nurse Communication: Mobility status PT Visit Diagnosis: Other abnormalities of gait and mobility (R26.89);Muscle weakness (generalized) (M62.81)    Time: 2229-7989 PT Time Calculation (min) (ACUTE ONLY): 14 min   Charges:   PT Evaluation $PT Eval Low Complexity: 1 Low          Eric Brewer, DPT  Acute Rehabilitation Services  Pager: [phone] Office: [phone]   Eric Brewer 06/17/2020, 2:27 PM

## 2020-06-18 ENCOUNTER — Telehealth: Payer: Self-pay

## 2020-06-18 NOTE — Telephone Encounter (Signed)
Pt needs appt with EP to discuss/implant loop.  Forwarding to scheduling.

## 2020-06-21 NOTE — Hospital Course (Addendum)
Eric Brewer is a 79 y.o. 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  PMH is significant for HTN, HLD, former smoker, and COPD.  CVA Patient presented with visual abnormalities, left arm tingling, and self-reported confusion with directions.  A full Neuro exam was performed and found only significant left visual field deficits.  Had CT which showed no acute hemmorage.  Had MRI notable for large acute/early subacute right PCA territory infarction predominantly affecting medial right temporal lobe and right occipital lobe.  Neurology was consulted for patient.  Patient also seen by PT/OT.  CTA head and neck were obtained which showed non-occluded carotid arteries and ECHO was obtained with normal EF and no concerning findings.  Unsure of source of Thromus that caused stroke.  Hypertension Patient's Olmesartan was held throughout visit to allow Permissive Hypertension.  Patient instructed to restart after visit.  Hyperlipidemia Lipid Labs were obtained.  Cholesterol-246, HDL-103, LDL-113, Trig-151. Patient was started on  Atorvastatin 80 mg

## 2020-06-21 NOTE — Discharge Summary (Addendum)
Family Medicine Teaching [facility] Discharge Summary  Patient name: Eric Brewer Medical record number: [id number] Date of birth: December 22, 1940 Age: 79 y.o. Gender: male Date of Admission: 06/16/2020  Date of Discharge: 06/17/20 Admitting Physician: Joana Reamer, DO  Primary Care Provider: Patient, No Pcp Per Consultants: Neuro   Indication for Hospitalization: AMS  Discharge Diagnoses/Problem List:  CVA, HTN, HLD, former smoker, and COPD.  Disposition: Able to be discharged home from hospital.  Discharge Condition: Stable  Discharge Exam:   Constitutional:      General: He is not in acute distress.    Appearance: Normal appearance. He is not ill-appearing.  HENT:     Head: Normocephalic and atraumatic.     Mouth/Throat:     Mouth: Mucous membranes are moist.  Cardiovascular:     Rate and Rhythm: Normal rate and regular rhythm.     Pulses: Normal pulses.  Pulmonary:     Effort: Pulmonary effort is normal.     Breath sounds: Normal breath sounds.  Abdominal:     General: Abdomen is flat. There is no distension.  Musculoskeletal:        General: Normal range of motion.  Skin:    General: Skin is warm.     Capillary Refill: Capillary refill takes less than 2 seconds.  Neurological:     General: No focal deficit present.     Mental Status: He is alert and oriented to person, place, and time.     Motor: No weakness.     Coordination: Coordination normal.     Gait: Gait normal.  Psychiatric:        Mood and Affect: Mood normal.        Behavior: Behavior normal.   Brief Hospital Course:  Eric Brewer is a 78 y.o. 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  PMH is significant for HTN, HLD, former smoker, and COPD.  CVA Patient presented with visual abnormalities, left arm tingling, and self-reported confusion with directions.  A full Neuro exam was performed and found left visual  field deficits. CT head showed no acute hemmorage. MRI brain notable for large acute/early subacute right PCA territory infarction predominantly affecting medial right temporal lobe and right occipital lobe.  Neurology was consulted for patient.  Patient also seen by PT/OT, who recommended outpatient therapies.  CTA head and neck were obtained which showed non-occluded carotid arteries. ECHO demonstrated normal EF and no concerning findings for embolic source.  At discharge, neurology recommended placement of a loop recorder to look for PAF as possible embolic source.   Hypertension Patient's Olmesartan was held throughout visit to allow permissive hypertension.  Patient instructed to restart at discharge  Hyperlipidemia Lipid panel obtained which demonstrated total cholesterol-246, HDL-103, LDL-113, Trig-151. Patient was started on high dose statin, Atorvastatin 80 mg, for risk modification.  Issues for Follow Up:  CVA- follow-up with Neurology in 4 weeks, obtain Loop Recorder from Electrophysiologist Visual Defecits and Confusion- PT/OT following up with patient after hospital stay Hypertension- Follow-up with PCP for normalization s/p permissive hypertension  Significant Procedures: Echocardiogram  Significant Labs and Imaging:  Recent Labs  Lab 06/16/20 1111 06/17/20 0407  WBC 7.2 8.2  HGB 13.7 12.9*  HCT 40.9 38.9*  PLT 217 197   Recent Labs  Lab 06/16/20 1111 06/17/20 0407  NA 143 141  K 4.8 4.1  CL 105 103  CO2 27 28  GLUCOSE 130*  130*  BUN 24* 22  CREATININE 1.13 1.00  CALCIUM 10.0 9.6  ALKPHOS 42  --   AST 28  --   ALT 19  --   ALBUMIN 4.1  --     EXAM: CT HEAD WITHOUT CONTRAST   TECHNIQUE: Contiguous axial images were obtained from the base of the skull through the vertex without intravenous contrast. Sagittal and coronal MPR images reconstructed from axial data set.   COMPARISON:  None   FINDINGS: Brain: Generalized atrophy. Normal ventricular  morphology. No midline shift or mass effect. Small vessel chronic ischemic changes of deep cerebral white matter. Subacute infarct identified at medial aspect of posterior RIGHT temporal lobe extending into RIGHT occipital lobe. No definite intracranial hemorrhage, mass lesion, or extra-axial fluid collection.   Vascular: High attenuation thrombus identified within RIGHT posterior cerebral artery branch extending to the infarcted segment. Atherosclerotic calcification of internal carotid arteries at skull base.   Skull: Intact   Sinuses/Orbits: Clear   Other: N/A   IMPRESSION: Atrophy with small vessel chronic ischemic changes of deep cerebral white matter.   Subacute infarct at medial aspect of posterior RIGHT temporal lobe extending into RIGHT occipital lobe.   High attenuation thrombus identified within RIGHT posterior cerebral artery branch extending to the infarcted segment.   Findings called to Dr. Denton Lank on 06/16/2020 at 1143 hours.     Electronically Signed   By: Ulyses Southward M.D.   On: 06/16/2020 11:44   EXAM: MRI HEAD WITHOUT CONTRAST   TECHNIQUE: Multiplanar, multiecho pulse sequences of the brain and surrounding structures were obtained without intravenous contrast.   COMPARISON:  Noncontrast head CT performed earlier the same day 06/16/2020.   FINDINGS: Brain:   Mild intermittent motion degradation.   Mild generalized parenchymal atrophy   There is a large acute/early subacute right PCA territory infarct predominantly affecting the medial right temporal lobe and right occipital lobe. Corresponding T2/FLAIR hyperintensity at this site. No significant mass effect. Subtle SWI signal loss at the infarction site likely reflecting petechial hemorrhage   Mild multifocal T2/FLAIR hyperintensity within the cerebral white matter is nonspecific, but consistent with chronic small vessel ischemic disease.   Prominent perivascular space versus small chronic  lacunar infarct within the right basal ganglia (series 6, image 15).   Probable tiny chronic infarcts within the bilateral cerebellar hemispheres.   No evidence of intracranial mass.   No extra-axial fluid collection.   Vascular: The right posterior cerebral artery is likely occluded. Otherwise, there are expected flow voids within the proximal large arterial vessels   Skull and upper cervical spine: No focal marrow lesion   Sinuses/Orbits: Visualized orbits show no acute finding. Prior right lens replacement. Minimal scattered paranasal sinus mucosal thickening. No significant mastoid effusion   IMPRESSION: Large acute/early subacute right PCA territory infarction predominantly affecting the medial right temporal lobe and right occipital lobe. Mild associated petechial hemorrhage. No significant mass effect.   Background mild generalized parenchymal atrophy and cerebral white matter chronic small vessel ischemic disease.   Prominent perivascular space versus small chronic lacunar infarct within the right basal ganglia.   Probable tiny chronic infarcts within the bilateral cerebellar hemispheres.     Electronically Signed   By: Jackey Loge DO   On: 06/16/2020 16:38   EXAM: CT ANGIOGRAPHY HEAD AND NECK   TECHNIQUE: Multidetector CT imaging of the head and neck was performed using the standard protocol during bolus administration of intravenous contrast. Multiplanar CT image reconstructions and MIPs were obtained to  evaluate the vascular anatomy. Carotid stenosis measurements (when applicable) are obtained utilizing NASCET criteria, using the distal internal carotid diameter as the denominator.   CONTRAST:  32mL OMNIPAQUE IOHEXOL 350 MG/ML SOLN   COMPARISON:  Brain MRI performed earlier the same day 06/16/2020, noncontrast head CT 06/16/2020.   FINDINGS: CTA NECK FINDINGS   Aortic arch: Standard aortic branching. Atherosclerotic plaque within the visualized  aortic arch and proximal major branch vessels of the neck. No hemodynamically significant innominate or proximal subclavian artery stenosis.   Right carotid system: CCA and ICA patent within the neck without significant stenosis (50% or greater). Mild to moderate calcified plaque within the carotid bifurcation and proximal ICA   Left carotid system: CCA and ICA patent within the neck without significant stenosis (50% or greater). Mild to moderate calcified plaque within the carotid bifurcation and proximal ICA.   Vertebral arteries: Codominant and patent within the neck bilaterally without stenosis. Mild nonstenotic calcified plaque at the origin of the left vertebral artery.   Skeleton: No acute bony abnormality or aggressive osseous lesion. Cervical spondylosis. Most notably at C5-C6 and C6-C7, there is advanced disc space narrowing with degenerative endplate irregularity and degenerative endplate sclerosis as well as shallow posterior disc osteophyte complexes and uncovertebral hypertrophy.   Other neck: No neck mass or cervical lymphadenopathy. Thyroid unremarkable.   Upper chest: No consolidation within the imaged lung apices.   Review of the MIP images confirms the above findings   CTA HEAD FINDINGS   Anterior circulation:   The intracranial internal carotid arteries are patent. Mild calcified plaque within both vessels without stenosis.   The M1 middle cerebral arteries are patent without significant stenosis. No M2 proximal branch occlusion or high-grade proximal stenosis is identified.   The anterior cerebral arteries are patent.   No intracranial aneurysm is identified.   Posterior circulation:   The intracranial vertebral arteries are patent. The basilar artery is patent. There is a hypoplastic right P1 segment with large right posterior communicating artery. The right posterior cerebral artery is occluded from the proximal P2 segment. The left  posterior communicating artery is hypoplastic or absent.   Venous sinuses: Within limitations of contrast timing, no convincing thrombus.   Anatomic variants: As described   Review of the MIP images confirms the above findings   IMPRESSION: CTA neck:   1. The bilateral common and internal carotid arteries are patent within the neck without hemodynamically significant stenosis. Mild-to-moderate calcified plaque within the carotid bifurcations and proximal internal carotid arteries bilaterally. 2. The vertebral arteries are codominant and patent within the neck bilaterally without stenosis.   CTA head:   Hypoplastic right P1 segment with large right posterior communicating artery. The right posterior cerebral artery is occluded from the proximal P2 segment.     Electronically Signed   By: Jackey Loge DO   On: 06/16/2020 19:38  Results/Tests Pending at Time of Discharge: None  Discharge Medications:  Allergies as of 06/17/2020   No Known Allergies      Medication List     STOP taking these medications    aspirin 325 MG tablet Replaced by: aspirin EC 81 MG tablet   Durezol 0.05 % Emul Generic drug: Difluprednate   oxyCODONE-acetaminophen 5-325 MG tablet Commonly known as: Roxicet   simvastatin 40 MG tablet Commonly known as: ZOCOR       TAKE these medications    aspirin EC 81 MG tablet Take 1 tablet (81 mg total) by mouth daily. Swallow whole. Replaces:  aspirin 325 MG tablet   atorvastatin 80 MG tablet Commonly known as: LIPITOR Take 1 tablet (80 mg total) by mouth daily.   clopidogrel 75 MG tablet Commonly known as: Plavix Take 1 tablet (75 mg total) by mouth daily for 21 days.   diphenhydrAMINE 25 MG tablet Commonly known as: BENADRYL Take 50 mg by mouth at bedtime as needed for sleep.   docusate sodium 100 MG capsule Commonly known as: Colace Take 1 capsule (100 mg total) by mouth 3 (three) times daily as needed.   FISH OIL PO Take 1  capsule by mouth daily.   ibuprofen 200 MG tablet Commonly known as: ADVIL Take 200 mg by mouth every 6 (six) hours as needed for mild pain.   montelukast 10 MG tablet Commonly known as: SINGULAIR Take 1 tablet by mouth at bedtime.   multivitamin with minerals tablet Take 1 tablet by mouth daily.   olmesartan 20 MG tablet Commonly known as: BENICAR Take 20 mg by mouth daily.   predniSONE 5 MG tablet Commonly known as: DELTASONE Take 5 mg by mouth daily with breakfast.   vitamin B-12 1000 MCG tablet Commonly known as: CYANOCOBALAMIN Take 1,000 mcg by mouth daily.   vitamin E 180 MG (400 UNITS) capsule Generic drug: vitamin E Take 400 Units by mouth daily.        Discharge Instructions: Please refer to Patient Instructions section of EMR for full details.  Patient was counseled important signs and symptoms that should prompt return to medical care, changes in medications, dietary instructions, activity restrictions, and follow up appointments.   Follow-Up Appointments:    Jovita KussmaulManess, Philip, MD 06/21/2020, 1:21 AM PGY-1, [facility]

## 2020-06-24 ENCOUNTER — Ambulatory Visit: Payer: Medicare Other | Admitting: Internal Medicine

## 2020-06-24 ENCOUNTER — Other Ambulatory Visit: Payer: Self-pay

## 2020-06-24 DIAGNOSIS — I639 Cerebral infarction, unspecified: Secondary | ICD-10-CM | POA: Diagnosis not present

## 2020-06-24 NOTE — Patient Instructions (Addendum)
Medication Instructions:  Your physician recommends that you continue on your current medications as directed. Please refer to the Current Medication list given to you today.  Do NOT take your PLAVIX for 3 days.  You may RESTART on June 27, 2020 (Thursday).  Labwork: None ordered.  Testing/Procedures: None ordered.  Follow-Up:  Your physician wants you to follow-up in: one year with Dr. Ladona Ridgel.   You will receive a reminder letter in the mail two months in advance. If you don't receive a letter, please call our office to schedule the follow-up appointment.    Implantable Loop Recorder Removal, Care After This sheet gives you information about how to care for yourself after your procedure. Your health care provider may also give you more specific instructions. If you have problems or questions, contact your health care provider. What can I expect after the procedure? After the procedure, it is common to have:  Soreness or discomfort near the incision.  Some swelling or bruising near the incision.  Follow these instructions at home: Incision care  1.  Leave your outer dressing on for 72 hours.  After 72 hours you can remove your outer dressing and shower. 2. Leave adhesive strips in place. These skin closures may need to stay in place for 1-2 weeks. If adhesive strip edges start to loosen and curl up, you may trim the loose edges.  You may remove the strips if they have not fallen off after 2 weeks. 3. Check your incision area every day for signs of infection. Check for: a. Redness, swelling, or pain. b. Fluid or blood. c. Warmth. d. Pus or a bad smell. 4. Do not take baths, swim, or use a hot tub until your incision is completely healed. 5. If your wound site starts to bleed apply pressure.      If you have any questions/concerns please call the device clinic at [phone].  Activity  Return to your normal activities.  Contact a health care provider if:  You have  redness, swelling, or pain around your incision.  You have a fever.

## 2020-06-24 NOTE — Progress Notes (Signed)
HPI 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 He has mostly recovered with minimal left arm tingling. He had right PCA territory infarct. He denies palpitations or a h/o atrial fib. He denies chest pain or sob and is fairly active. He has been taking plavix with no bleeding.  No Known Allergies   Current Outpatient Medications  Medication Sig Dispense Refill   aspirin EC 81 MG tablet Take 1 tablet (81 mg total) by mouth daily. Swallow whole. 30 tablet 0   atorvastatin (LIPITOR) 80 MG tablet Take 1 tablet (80 mg total) by mouth daily. 90 tablet 0   clopidogrel (PLAVIX) 75 MG tablet Take 1 tablet (75 mg total) by mouth daily for 21 days. 21 tablet 0   diphenhydrAMINE (BENADRYL) 25 MG tablet Take 50 mg by mouth at bedtime as needed for sleep.     docusate sodium (COLACE) 100 MG capsule Take 1 capsule (100 mg total) by mouth 3 (three) times daily as needed. 20 capsule 0   ibuprofen (ADVIL) 200 MG tablet Take 200 mg by mouth every 6 (six) hours as needed for mild pain.     montelukast (SINGULAIR) 10 MG tablet Take 1 tablet by mouth at bedtime.  3   Multiple Vitamins-Minerals (MULTIVITAMIN WITH MINERALS) tablet Take 1 tablet by mouth daily.     olmesartan (BENICAR) 40 MG tablet Take 40 mg by mouth daily.     Omega-3 Fatty Acids (FISH OIL PO) Take 1 capsule by mouth daily.     predniSONE (DELTASONE) 5 MG tablet Take 5 mg by mouth daily with breakfast.     vitamin B-12 (CYANOCOBALAMIN) 1000 MCG tablet Take 1,000 mcg by mouth daily.     vitamin E (VITAMIN E) 180 MG (400 UNITS) capsule Take 400 Units by mouth daily.     No current facility-administered medications for this visit.     Past Medical History:  Diagnosis Date   Arthritis    COPD (chronic obstructive pulmonary disease) (HCC)    MINIMAL    History of kidney stones     ROS:   All systems reviewed and negative except as noted in the HPI.   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   Social History   Socioeconomic History   Marital status: Married    Spouse name: Not on file   Number of children: Not on file   Years of education: Not on file   Highest education level: Not on file  Occupational History   Not on file  Tobacco Use   Smoking status: Former Smoker  Substance and Sexual Activity   Alcohol use: Yes    Comment: GLASS WINE OR DRINK DAILY   Drug use: No   Sexual activity: Not on file  Other Topics Concern   Not on file  Social History Narrative   Not on file   Social Determinants of Health   Financial Resource Strain:    Difficulty of Paying Living Expenses: Not on file  Food Insecurity:    Worried About Running Out  of Food in the Last Year: Not on file   Ran Out of Food in the Last Year: Not on file  Transportation Needs:    Lack of Transportation (Medical): Not on file   Lack of Transportation (Non-Medical): Not on file  Physical Activity:    Days of Exercise per Week: Not on file   Minutes of Exercise per Session: Not on file  Stress:    Feeling of Stress : Not on file  Social Connections:    Frequency of Communication with Friends and Family: Not on file   Frequency of Social Gatherings with Friends and Family: Not on file   Attends Religious Services: Not on file   Active Member of Clubs or Organizations: Not on file   Attends Banker Meetings: Not on file   Marital Status: Not on file  Intimate Partner Violence:    Fear of Current or Ex-Partner: Not on file   Emotionally Abused: Not on file    Physically Abused: Not on file   Sexually Abused: Not on file     BP 130/74    Pulse 74    Ht 5\' 10"  (1.778 m)    Wt 200 lb 12.8 oz (91.1 kg)    SpO2 93%    BMI 28.81 kg/m   Physical Exam:  Well appearing NAD HEENT: Unremarkable Neck:  No JVD, no thyromegally Lymphatics:  No adenopathy Back:  No CVA tenderness Lungs:  Clear HEART:  Regular rate rhythm, no murmurs, no rubs, no clicks Abd:  soft, positive bowel sounds, no organomegally, no rebound, no guarding Ext:  2 plus pulses, no edema, no cyanosis, no clubbing Skin:  No rashes no nodules Neuro:  CN II through XII intact, motor grossly intact   Assess/Plan: 1. Cryptogenic stroke - I have discussed the indications/risks/benefits/goals/expectations and he is willing to proceed with ILR insertion. 2. HTN - his bp is well controlled.  No change in his meds.  EP Procedure Note  Preoperative diagnosis: cryptogenic stroke  Postoperative diagnosis: cryptogenic stroke  Procedure Performed: ILR insertion  Description of the procedure: after informed consent was obtained, the patient was prepped and draped in a sterile manner. 10 cc of lidocaine was infiltrated into the left pectoral region. A one cm stab incision was carried out. A medtronic ILR Linq2 was inserted, G was placed. The R wave measured 0.3. Benzoin and steristrips were painted on the skin, a bandage was applied and the patient recovered in the usual manner.  Complications: none immediately  Conclusion: successful ILR insertion.  #[id number] Naoko Diperna,MD

## 2020-07-01 ENCOUNTER — Other Ambulatory Visit: Payer: Self-pay | Admitting: *Deleted

## 2020-07-01 NOTE — Telephone Encounter (Signed)
Patients wife, [name] called and wanted to know if patient would need to continue taking the clopidogrel and if so, would Dr Ladona Ridgel refill this for him? He has nine doses left. Please advise. Thanks, MI

## 2020-07-02 MED ORDER — CLOPIDOGREL BISULFATE 75 MG PO TABS: 75.0000 mg | ORAL_TABLET | Freq: Every day | ORAL | 0 refills | Status: DC

## 2020-07-02 NOTE — Telephone Encounter (Signed)
Per Dr. Junie Spencer to give 30 days worth.

## 2020-07-02 NOTE — Telephone Encounter (Signed)
Called pt's wife concerning this matter and the wife would like for Roney Mans, RN, Dr. Lubertha Basque nurse to give her a call back concerning this matter. Please address

## 2020-07-03 ENCOUNTER — Other Ambulatory Visit: Payer: Self-pay | Admitting: Internal Medicine

## 2020-07-11 ENCOUNTER — Other Ambulatory Visit: Payer: Self-pay

## 2020-07-11 ENCOUNTER — Encounter: Payer: Self-pay | Admitting: Physical Therapy

## 2020-07-11 ENCOUNTER — Ambulatory Visit: Payer: Medicare Other | Attending: Nurse Practitioner | Admitting: Physical Therapy

## 2020-07-11 DIAGNOSIS — R262 Difficulty in walking, not elsewhere classified: Secondary | ICD-10-CM | POA: Diagnosis not present

## 2020-07-11 DIAGNOSIS — R2689 Other abnormalities of gait and mobility: Secondary | ICD-10-CM | POA: Diagnosis present

## 2020-07-11 NOTE — Therapy (Signed)
[facility] [place] [address] [place], [place], [address] Phone: [phone]   Fax:  [phone]  Physical Therapy Evaluation  Patient Details  Name: Eric Brewer MRN: [id number] Date of Birth: [birth year] Referring Provider (PT): Yetta Barre   Encounter Date: 07/11/2020   PT End of Session - 07/11/20 0850    Visit Number 1    Date for PT Re-Evaluation 09/10/20    PT Start Time 0803    PT Stop Time 0844    PT Time Calculation (min) 41 min    Activity Tolerance Patient tolerated treatment well    Behavior During Therapy [facility] for tasks assessed/performed           Past Medical History:  Diagnosis Date  . Arthritis   . COPD (chronic obstructive pulmonary disease) (HCC)    MINIMAL   . History of kidney stones     Past Surgical History:  Procedure Laterality Date  . CATARACT EXTRACTION W/ INTRAOCULAR LENS IMPLANT     RIGHT  . 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 TONSILLECTOMY    . TOTAL SHOULDER ARTHROPLASTY Right 10/25/2014   Procedure: TOTAL SHOULDER ARTHROPLASTY;  Surgeon: Mable Paris, MD;  Location: [facility] OR;  Service: Orthopedics;  Laterality: Right;  Right total shoulder arthroplasty    There were no vitals filed for this visit.    Subjective Assessment - 07/11/20 0808    Subjective Pt reports CVA on 06/16/20. Pt states that since CVA he has been having trouble with peripheral vision in his L eye, balance difficulties, and trouble walking over uneven ground. Pt reports he has been bumping into things from time to time.    Pertinent History COPD, arthritis    Limitations Walking;House hold activities    Patient Stated Goals get back to normal    Currently in Pain? No/denies              [facility] PT Assessment - 07/11/20 0001      Assessment   Medical Diagnosis CVA    Referring Provider (PT) Yetta Barre    Onset Date/Surgical Date 06/16/20    Hand Dominance Right    Prior Therapy PT for shoulder replacement       Precautions   Precautions None      Restrictions   Weight Bearing Restrictions No      Balance Screen   Has the patient fallen in the past 6 months No    Has the patient had a decrease in activity level because of a fear of falling?  No    Is the patient reluctant to leave their home because of a fear of falling?  No      Home Environment   Additional Comments 2 story home; reports careful with stairs and needs railing      Prior Function   Level of Independence Independent    Vocation Retired    Education officer, environmental   Overall Cognitive Status --   difficulty navigating back to room after going to bathroom   Memory --   reports having trouble with memory since stroke     Sensation   Light Touch Appears Intact      Coordination   Finger Nose Finger Test overshooting and intention tremors L>R    Heel Shin Test [facility]      Functional Tests   Functional tests Single leg stance      Single Leg Stance  Comments increased instability on LLE      Posture/Postural Control   Posture/Postural Control Postural limitations    Postural Limitations Rounded Shoulders;Forward head      ROM / Strength   AROM / PROM / Strength AROM;Strength      AROM   Overall AROM Comments UE AROM WFL B; some deficits with RUE shoulder ROM d/t prior shoulder replacement      Strength   Overall Strength Comments WFL B      Transfers   Five time sit to stand comments  <12 sec, WFL      Ambulation/Gait   Ambulation/Gait Yes    Ambulation/Gait Assistance 7: Independent    Ambulation Distance (Feet) 50 Feet    Assistive device None    Gait Pattern Decreased arm swing - left;Decreased step length - right;Decreased step length - left;Decreased hip/knee flexion - right;Decreased hip/knee flexion - left;Shuffle;Poor foot clearance - left;Poor foot clearance - right    Ambulation Surface Level    Gait velocity slow, shuffling steps with poor foot clearance B L>R    Gait Comments decreased  arm swing, shuffling steps, inattention to objects on L side      Standardized Balance Assessment   Standardized Balance Assessment Berg Balance Test      Berg Balance Test   Sit to Stand Able to stand without using hands and stabilize independently    Standing Unsupported Able to stand safely 2 minutes    Sitting with Back Unsupported but Feet Supported on Floor or Stool Able to sit safely and securely 2 minutes    Stand to Sit Sits safely with minimal use of hands    Transfers Able to transfer safely, minor use of hands    Standing Unsupported with Eyes Closed Able to stand 10 seconds safely    Standing Unsupported with Feet Together Able to place feet together independently and stand 1 minute safely    From Standing, Reach Forward with Outstretched Arm Can reach forward >12 cm safely (5")    From Standing Position, Pick up Object from Floor Able to pick up shoe safely and easily    From Standing Position, Turn to Look Behind Over each Shoulder Looks behind from both sides and weight shifts well    Turn 360 Degrees Able to turn 360 degrees safely in 4 seconds or less    Standing Unsupported, Alternately Place Feet on Step/Stool Able to complete >2 steps/needs minimal assist    Standing Unsupported, One Foot in Front Able to take small step independently and hold 30 seconds    Standing on One Leg Tries to lift leg/unable to hold 3 seconds but remains standing independently    Total Score 47                      Objective measurements completed on examination: See above findings.       [facility] Adult PT Treatment/Exercise - 07/11/20 0001      High Level Balance   High Level Balance Comments SLS, tandem stance, marching at counter                  PT Education - 07/11/20 0850    Education Details Pt educated on POC and HEP    Person(s) Educated Patient;Spouse    Methods Explanation;Demonstration;Handout    Comprehension Verbalized understanding;Returned  demonstration            PT Short Term Goals - 07/11/20 [phone]  PT SHORT TERM GOAL #1   Title Pt will be I with initial HEP    Time 2    Period Weeks    Status New    Target Date 07/25/20             PT Long Term Goals - 07/11/20 0902      PT LONG TERM GOAL #1   Title Pt will be I with advanced HEP    Time 6    Period Weeks    Status New    Target Date 08/22/20      PT LONG TERM GOAL #2   Title Pt will demo gait with increased B foot clearance and heel strike    Time 6    Period Weeks    Status New    Target Date 08/22/20      PT LONG TERM GOAL #3   Title Pt will report return to gym fitness program with no increased pain or balance difficulties    Time 6    Period Weeks    Status New    Target Date 08/22/20                  Plan - 07/11/20 0850    Clinical Impression Statement Pt presents to clinic s/p R PCA infarct on 06/16/2020. Pt demos deficits to L peripheral vision, inattention to objects on L side (difficulty navigating back to tx room; missed door on L side), reports of memory deficits, shuffling gait, decreased trunk rotation with gait, and balance deficits. Pt is very active walking several miles a day and golfing. Pt would like to return to gym fitness program. Pt would benefit from skilled PT to address the above impairments.    Personal Factors and Comorbidities Comorbidity 2    Comorbidities COPD, arthritis    Examination-Activity Limitations Locomotion Level    Examination-Participation Restrictions Community Activity;Interpersonal Relationship    Stability/Clinical Decision Making Evolving/Moderate complexity    Clinical Decision Making Low    Rehab Potential Good    PT Frequency 2x / week    PT Duration 6 weeks    PT Treatment/Interventions ADLs/Self Care Home Management;Gait training;Stair training;Functional mobility training;Therapeutic activities;Therapeutic exercise;Balance training;Neuromuscular re-education;Manual  techniques;Patient/family education    PT Next Visit Plan gait training, balance ex's, return to gym activities, potential speech referral    PT Home Exercise Plan SLS, tandem stance, marching at counter    Recommended Other Services potential speech referral    Consulted and Agree with Plan of Care Patient           Patient will benefit from skilled therapeutic intervention in order to improve the following deficits and impairments:  Abnormal gait, Difficulty walking, Decreased safety awareness, Impaired perceived functional ability, Impaired vision/preception, Decreased balance, Decreased mobility  Visit Diagnosis: Difficulty in walking, not elsewhere classified  Balance problem     Problem List Patient Active Problem List   Diagnosis Date Noted  . 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 CVA (cerebral vascular accident) (HCC) 06/16/2020  . 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 [place], [place], [address] Phone: [phone]   Fax:  [phone]  Name: Eric Brewer MRN: [id number] Date of Birth: 19-Feb-1941

## 2020-07-11 NOTE — Patient Instructions (Signed)
Access Code: [email or web address] URL: [email or web address] Date: 07/11/2020 Prepared by: Lysle Rubens  Exercises Single Leg Stance with Support - 2 x daily - 7 x weekly - 10 reps - 1 sets - 10 hold Standing Marching - 2 x daily - 7 x weekly - 10 reps - 1 sets - 3 hold Standing Tandem Balance with Counter Support - 1 x daily - 7 x weekly - 3 sets - 3 reps - 20-30 sec hold

## 2020-07-16 ENCOUNTER — Ambulatory Visit: Payer: Medicare Other | Admitting: Physical Therapy

## 2020-07-16 ENCOUNTER — Other Ambulatory Visit: Payer: Self-pay

## 2020-07-16 DIAGNOSIS — R262 Difficulty in walking, not elsewhere classified: Secondary | ICD-10-CM | POA: Diagnosis not present

## 2020-07-16 DIAGNOSIS — R2689 Other abnormalities of gait and mobility: Secondary | ICD-10-CM

## 2020-07-16 NOTE — Therapy (Signed)
[facility] [place] [address] [place], [place], [address] Phone: [phone]   Fax:  [phone]  Physical Therapy Treatment  Patient Details  Name: Eric Brewer MRN: [id number] Date of Birth: [birth year] Referring Provider (PT): Yetta Barre   Encounter Date: 07/16/2020   PT End of Session - 07/16/20 0911    Visit Number 2    Date for PT Re-Evaluation 09/10/20    PT Start Time 0841    PT Stop Time 0920    PT Time Calculation (min) 39 min           Past Medical History:  Diagnosis Date  . Arthritis   . COPD (chronic obstructive pulmonary disease) (HCC)    MINIMAL   . History of kidney stones     Past Surgical History:  Procedure Laterality Date  . CATARACT EXTRACTION W/ INTRAOCULAR LENS IMPLANT     RIGHT  . 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 TONSILLECTOMY    . TOTAL SHOULDER ARTHROPLASTY Right 10/25/2014   Procedure: TOTAL SHOULDER ARTHROPLASTY;  Surgeon: Mable Paris, MD;  Location: [facility] OR;  Service: Orthopedics;  Laterality: Right;  Right total shoulder arthroplasty    There were no vitals filed for this visit.   Subjective Assessment - 07/16/20 0850    Subjective doing okay- need to get better from stroke. pt verb walking home after PT today about 1.25 miles    Currently in Pain? No/denies                             [facility] Adult PT Treatment/Exercise - 07/16/20 0001      High Level Balance   High Level Balance Activities Negotiating over obstacles;Tandem walking;Side stepping   on foam beam   High Level Balance Comments ball toss on airex   fwd and SW stepping very 2 inch obj     Exercises   Exercises Lumbar;Knee/Hip      Knee/Hip Exercises: Aerobic   Recumbent Bike 6 min      Knee/Hip Exercises: Machines for Strengthening   Cybex Knee Extension 15 # 2 sets 10    Cybex Knee Flexion 25# 2 sets 10      Knee/Hip Exercises: Standing   Walking with Sports Cord 30# 5 x each way  without LOB but cuing needed    Other Standing Knee Exercises 8 in step tap 2 sets 20      Knee/Hip Exercises: Seated   Sit to Sand 2 sets;10 reps;without UE support   on airex                   PT Short Term Goals - 07/11/20 0856      PT SHORT TERM GOAL #1   Title Pt will be I with initial HEP    Time 2    Period Weeks    Status New    Target Date 07/25/20             PT Long Term Goals - 07/11/20 0902      PT LONG TERM GOAL #1   Title Pt will be I with advanced HEP    Time 6    Period Weeks    Status New    Target Date 08/22/20      PT LONG TERM GOAL #2   Title Pt will demo gait with increased B foot clearance and heel strike  Time 6    Period Weeks    Status New    Target Date 08/22/20      PT LONG TERM GOAL #3   Title Pt will report return to gym fitness program with no increased pain or balance difficulties    Time 6    Period Weeks    Status New    Target Date 08/22/20                 Plan - 07/16/20 0911    Clinical Impression Statement pt did very well with cardio and strength interventions. pt did need some assistance and cuing with all balance interventions. pt has decreased corrdination, needs extra processing time and decreased Left visual field.    PT Treatment/Interventions ADLs/Self Care Home Management;Gait training;Stair training;Functional mobility training;Therapeutic activities;Therapeutic exercise;Balance training;Neuromuscular re-education;Manual techniques;Patient/family education    PT Next Visit Plan gait training, balance ex's, return to gym activities, potential speech referral           Patient will benefit from skilled therapeutic intervention in order to improve the following deficits and impairments:  Abnormal gait, Difficulty walking, Decreased safety awareness, Impaired perceived functional ability, Impaired vision/preception, Decreased balance, Decreased mobility  Visit Diagnosis: Balance  problem  Difficulty in walking, not elsewhere classified     Problem List Patient Active Problem List   Diagnosis Date Noted  . 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 CVA (cerebral vascular accident) (HCC) 06/16/2020  . 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 [place], [place], [address] Phone: [phone]   Fax:  [phone]  Name: Eric Brewer MRN: [id number] Date of Birth: November 13, 1940

## 2020-07-18 ENCOUNTER — Encounter: Payer: Self-pay | Admitting: Physical Therapy

## 2020-07-18 ENCOUNTER — Other Ambulatory Visit: Payer: Self-pay

## 2020-07-18 ENCOUNTER — Ambulatory Visit: Payer: Medicare Other | Admitting: Physical Therapy

## 2020-07-18 DIAGNOSIS — R262 Difficulty in walking, not elsewhere classified: Secondary | ICD-10-CM | POA: Diagnosis not present

## 2020-07-18 DIAGNOSIS — R2689 Other abnormalities of gait and mobility: Secondary | ICD-10-CM

## 2020-07-18 NOTE — Therapy (Signed)
[facility] [place] [address] [place], [place], [address] Phone: [phone]   Fax:  [phone]  Physical Therapy Treatment  Patient Details  Name: Eric Brewer MRN: [id number] Date of Birth: Feb 15, 1941 Referring Provider (PT): Yetta Barre   Encounter Date: 07/18/2020   PT End of Session - 07/18/20 0938    Visit Number 3    Date for PT Re-Evaluation 09/10/20    PT Start Time 0843    PT Stop Time 0925    PT Time Calculation (min) 42 min    Activity Tolerance Patient tolerated treatment well    Behavior During Therapy [facility] for tasks assessed/performed           Past Medical History:  Diagnosis Date  . Arthritis   . COPD (chronic obstructive pulmonary disease) (HCC)    MINIMAL   . History of kidney stones     Past Surgical History:  Procedure Laterality Date  . CATARACT EXTRACTION W/ INTRAOCULAR LENS IMPLANT     RIGHT  . 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 TONSILLECTOMY    . TOTAL SHOULDER ARTHROPLASTY Right 10/25/2014   Procedure: TOTAL SHOULDER ARTHROPLASTY;  Surgeon: Mable Paris, MD;  Location: [facility] OR;  Service: Orthopedics;  Laterality: Right;  Right total shoulder arthroplasty    There were no vitals filed for this visit.   Subjective Assessment - 07/18/20 0844    Subjective Pt reports he is doing well; walked to clinic this morning    Currently in Pain? No/denies                             [facility] Adult PT Treatment/Exercise - 07/18/20 0001      High Level Balance   High Level Balance Activities Side stepping;Backward walking;Tandem walking    High Level Balance Comments sidestepping and forward stepping on airex; balance on foam balance beam; sidestepping on foam balance beam      Lumbar Exercises: Machines for Strengthening   Other Lumbar Machine Exercise rows and lat pulls 45# 2x10      Knee/Hip Exercises: Aerobic   Nustep L5 x 7 min      Knee/Hip Exercises: Standing    Other Standing Knee Exercises alt stair tap 2x10 B                    PT Short Term Goals - 07/11/20 0856      PT SHORT TERM GOAL #1   Title Pt will be I with initial HEP    Time 2    Period Weeks    Status New    Target Date 07/25/20             PT Long Term Goals - 07/11/20 0902      PT LONG TERM GOAL #1   Title Pt will be I with advanced HEP    Time 6    Period Weeks    Status New    Target Date 08/22/20      PT LONG TERM GOAL #2   Title Pt will demo gait with increased B foot clearance and heel strike    Time 6    Period Weeks    Status New    Target Date 08/22/20      PT LONG TERM GOAL #3   Title Pt will report return to gym fitness program with no increased pain or balance  difficulties    Time 6    Period Weeks    Status New    Target Date 08/22/20                 Plan - 07/18/20 0939    Clinical Impression Statement Pt required CGA for balance interventions on airex mat and balance beam with majority of LOB posteriorly. Pt did well with alternating step taps with supervision assist; some difficulty with foot clearance with fatigue. Discussed potential for speech eval with pt; memory impairments present and some difficulty following multi-step instructions.    PT Treatment/Interventions ADLs/Self Care Home Management;Gait training;Stair training;Functional mobility training;Therapeutic activities;Therapeutic exercise;Balance training;Neuromuscular re-education;Manual techniques;Patient/family education    PT Next Visit Plan gait training, balance ex's, return to gym activities    Consulted and Agree with Plan of Care Patient           Patient will benefit from skilled therapeutic intervention in order to improve the following deficits and impairments:  Abnormal gait, Difficulty walking, Decreased safety awareness, Impaired perceived functional ability, Impaired vision/preception, Decreased balance, Decreased mobility  Visit  Diagnosis: Balance problem  Difficulty in walking, not elsewhere classified     Problem List Patient Active Problem List   Diagnosis Date Noted  . 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 CVA (cerebral vascular accident) (HCC) 06/16/2020  . 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 [place], [place], [address] Phone: [phone]   Fax:  [phone]  Name: Eric Brewer MRN: [id number] Date of Birth: 1940/11/06

## 2020-07-22 ENCOUNTER — Ambulatory Visit: Payer: Medicare Other | Admitting: Physical Therapy

## 2020-07-22 ENCOUNTER — Other Ambulatory Visit: Payer: Self-pay

## 2020-07-22 ENCOUNTER — Encounter: Payer: Self-pay | Admitting: Physical Therapy

## 2020-07-22 ENCOUNTER — Other Ambulatory Visit: Payer: Self-pay | Admitting: Internal Medicine

## 2020-07-22 DIAGNOSIS — R262 Difficulty in walking, not elsewhere classified: Secondary | ICD-10-CM

## 2020-07-22 DIAGNOSIS — R2689 Other abnormalities of gait and mobility: Secondary | ICD-10-CM

## 2020-07-22 NOTE — Therapy (Signed)
[facility] [place] [address] [place], [place], [address] Phone: [phone]   Fax:  [phone]  Physical Therapy Treatment  Patient Details  Name: Eric Brewer MRN: [id number] Date of Birth: 17-Apr-1941 Referring Provider (PT): Yetta Barre   Encounter Date: 07/22/2020   PT End of Session - 07/22/20 0844    Visit Number 4    Date for PT Re-Evaluation 09/10/20    PT Start Time 0802    PT Stop Time 0844    PT Time Calculation (min) 42 min    Activity Tolerance Patient tolerated treatment well    Behavior During Therapy [facility] for tasks assessed/performed           Past Medical History:  Diagnosis Date  . Arthritis   . COPD (chronic obstructive pulmonary disease) (HCC)    MINIMAL   . History of kidney stones     Past Surgical History:  Procedure Laterality Date  . CATARACT EXTRACTION W/ INTRAOCULAR LENS IMPLANT     RIGHT  . 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 TONSILLECTOMY    . TOTAL SHOULDER ARTHROPLASTY Right 10/25/2014   Procedure: TOTAL SHOULDER ARTHROPLASTY;  Surgeon: Mable Paris, MD;  Location: [facility] OR;  Service: Orthopedics;  Laterality: Right;  Right total shoulder arthroplasty    There were no vitals filed for this visit.   Subjective Assessment - 07/22/20 0806    Subjective Pt reports doing well overall; thinks memory problems are getting better. Walked to clinic this morning.    Currently in Pain? No/denies                             [facility] Adult PT Treatment/Exercise - 07/22/20 0001      High Level Balance   High Level Balance Activities Side stepping;Backward walking;Tandem walking    High Level Balance Comments sidestepping and forward stepping on airex      Knee/Hip Exercises: Aerobic   Nustep L5 x 7 min      Knee/Hip Exercises: Machines for Strengthening   Cybex Knee Extension 15# 2x15    Cybex Knee Flexion 25# 2x15      Knee/Hip Exercises: Standing   Heel Raises  Both;1 set;15 reps    Walking with Sports Cord 30# x5 each direction    Other Standing Knee Exercises alt stair tap 2x10 B    Other Standing Knee Exercises lateral step ups 6" x10 B                    PT Short Term Goals - 07/22/20 0839      PT SHORT TERM GOAL #1   Title Pt will be I with initial HEP    Time 2    Period Weeks    Status Achieved    Target Date 07/25/20             PT Long Term Goals - 07/22/20 8315      PT LONG TERM GOAL #1   Title Pt will be I with advanced HEP    Time 6    Period Weeks    Status On-going      PT LONG TERM GOAL #2   Title Pt will demo gait with increased B foot clearance and heel strike    Time 6    Period Weeks    Status On-going      PT LONG TERM GOAL #  3   Title Pt will report return to gym fitness program with no increased pain or balance difficulties    Time 6    Period Weeks    Status Achieved                 Plan - 07/22/20 0844    Clinical Impression Statement Pt did well with strength interventions; states that he has fully returned to community gym and is slowly progressing machine weights with no issues. Less impulsivity on resisted gait this rx and no LOB. Did demo difficulty with tandem stance and tandem walking esp on L side. Foot clearance decreased with increasing fatigue. Pt reports he will see neurologist tomorrow 10/19.    PT Treatment/Interventions ADLs/Self Care Home Management;Gait training;Stair training;Functional mobility training;Therapeutic activities;Therapeutic exercise;Balance training;Neuromuscular re-education;Manual techniques;Patient/family education    PT Next Visit Plan gait training, balance ex's, return to gym activities    Consulted and Agree with Plan of Care Patient           Patient will benefit from skilled therapeutic intervention in order to improve the following deficits and impairments:  Abnormal gait, Difficulty walking, Decreased safety awareness, Impaired perceived  functional ability, Impaired vision/preception, Decreased balance, Decreased mobility  Visit Diagnosis: Balance problem  Difficulty in walking, not elsewhere classified     Problem List Patient Active Problem List   Diagnosis Date Noted  . 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 CVA (cerebral vascular accident) (HCC) 06/16/2020  . 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 [place], [place], [address] Phone: [phone]   Fax:  [phone]  Name: Eric Brewer MRN: [id number] Date of Birth: 1940-12-17

## 2020-07-23 ENCOUNTER — Encounter: Payer: Self-pay | Admitting: Adult Health

## 2020-07-23 ENCOUNTER — Telehealth: Payer: Self-pay | Admitting: Adult Health

## 2020-07-23 ENCOUNTER — Ambulatory Visit: Payer: Medicare Other | Admitting: Adult Health

## 2020-07-23 VITALS — BP 135/73 | HR 65 | Ht 70.0 in | Wt 202.0 lb

## 2020-07-23 DIAGNOSIS — I10 Essential (primary) hypertension: Secondary | ICD-10-CM

## 2020-07-23 DIAGNOSIS — I639 Cerebral infarction, unspecified: Secondary | ICD-10-CM

## 2020-07-23 DIAGNOSIS — E785 Hyperlipidemia, unspecified: Secondary | ICD-10-CM

## 2020-07-23 DIAGNOSIS — I69319 Unspecified symptoms and signs involving cognitive functions following cerebral infarction: Secondary | ICD-10-CM

## 2020-07-23 DIAGNOSIS — R29818 Other symptoms and signs involving the nervous system: Secondary | ICD-10-CM | POA: Diagnosis not present

## 2020-07-23 NOTE — Telephone Encounter (Signed)
No indication to wait 6 months post stroke for routine teeth cleaning as blood thinners do not need to be held

## 2020-07-23 NOTE — Telephone Encounter (Signed)
Pt's wife, Rayvion Stumph (on [place]) When I told the dentist hygienist he had a stroke, she said call his neurologist. Neurologist usually have pt to wait 6 months before having teeth cleaned. Would like advise if I need to reschedule his teeth cleaning.

## 2020-07-23 NOTE — Patient Instructions (Signed)
Continue working with PT for balance impairment   Follow up with eye doctor Thursday and request evaluation be sent to office after visit  Continue aspirin 81 mg daily  and atorvastatin 80mg  daily  for secondary stroke prevention Stop plavix at this time as no longer indicated  Loop recorder will continue to be monitored for possible atrial fibrillation  Referral placed to [facility] sleep clinic for evaluation of possible sleep apnea  Referral placed to neuro rehab speech therapy for cognitive evaluation  Would not recommend return to driving until clearance by your eye doctor and cognitive concerns  Continue to follow up with PCP regarding cholesterol and blood pressure management  Maintain strict control of hypertension with blood pressure goal below 130/90 and cholesterol with LDL cholesterol (bad cholesterol) goal below 70 mg/dL.      Followup in the future with me in 3 months or call earlier if needed       Thank you for coming to see at [facility]. I hope we have been able to provide you high quality care today.  You may receive a patient satisfaction survey over the next few weeks. We would appreciate your feedback and comments so that we may continue to improve ourselves and the health of our patients.

## 2020-07-23 NOTE — Progress Notes (Signed)
[facility] [address] [place] [id number] [phone]       HOSPITAL FOLLOW UP NOTE  Mr. Eric NiemannGeorge D Brewer Date of Birth:  07/14/1941 Medical Record Number:  [id number]   Reason for Referral:  hospital stroke follow up    SUBJECTIVE:   CHIEF COMPLAINT:  Chief Complaint  Patient presents with  . Hospitalization Follow-up    tx rm  . Cerebrovascular Accident    HPI:   [name] is a 79 y.o. 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  Personally reviewed recent hospitalization pertinent progress notes, lab work and imaging with summary provided.  Stroke work-up revealed R PCA territory infarct with mild petechial hemorrhage, embolic pattern secondary to unknown source.  Recommended 30-day cardiac event monitor vs loop recorder outpatient to assess for A. fib as possible stroke etiology.  Recommended DAPT for 3 weeks and aspirin alone.  HTN stable with long-term BP goal normotensive range.  LDL 113 and switch simvastatin to atorvastatin 80 mg daily.  Other stroke risk factors include advanced age, former tobacco use, EtOH use and obesity.  Other active problems include baseline short-term memory deficits and COPD.  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  Evaluated by therapy and recommended outpatient PT/OT  Stroke:   R PCA territory  infarct - embolic - secondary to unknown source  CT head subacute R temporal lobe into R occipital infarct. R PCA thrombus extending into infarct. Small vessel disease. Atrophy.   MRI  Large R PCA territory infarct w/ mild petechial hemorrhage. Small vessel disease. Atrophy. Possible R basal ganglia chronic lacune vs vascular space. Probable tiny B cerebellar infarcts.  CTA head hypoplastic R P1 w/ large R PCom. R PCA occlusion from  proximal P2.  CTA neck mild to moderate plaque B ICA bifurcations and proximal   2D Echo EF 55-60%. No source of embolus   LDL 113  HgbA1c 6.3  VTE prophylaxis - SCDs   states he does not take aspirin prior to admission prior to admission, now on No antithrombotic. 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 Discussed w/ FP team  Today, 07/23/2020, Eric Brewer is being seen for hospital follow-up accompanied by his daughter.  Reports residual imbalance, left periphery impairment and cognitive impairment.  Has been working with PT with improvement of imbalance.  He does report decreased vision OS at baseline from corneal transplant but worse post stroke. Has appt with ophthalmology Thursday.  He requests return to driving.  Short-term memory concerns prior to his stroke with worsening post stroke. Long-term memory intact.  Denies new or worsening stroke/TIA symptoms.  He has continued on DAPT despite 3-week recommendation but denies bleeding or bruising.  Continues on atorvastatin 80 mg daily without myalgias.  Blood pressure today 135/73.  Monitors at home which has been stable. Evaluated by Dr. Ladona Ridgelaylor on 06/24/2020 with loop recorder placed which has not shown atrial fibrillation thus far.  He does endorse symptoms of snoring, daytime fatigue despite going to bed around 7 PM and awakening at 6 AM, insomnia, and nocturia.  Denies any witnessed apnea.  No prior sleep study to evaluate for sleep apnea.  No further concerns at this time.      ROS:   14 system review of systems performed and negative with exception of those listed in HPI  PMH:  Past Medical History:  Diagnosis Date  . Arthritis   . COPD (chronic obstructive pulmonary disease) (HCC)    MINIMAL   . History  of kidney stones     PSH:  Past Surgical History:  Procedure Laterality Date  . CATARACT EXTRACTION W/ INTRAOCULAR LENS IMPLANT     RIGHT  . 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 TONSILLECTOMY    . TOTAL SHOULDER ARTHROPLASTY Right 10/25/2014   Procedure: TOTAL SHOULDER ARTHROPLASTY;  Surgeon: Mable Paris, MD;  Location: [facility] OR;  Service: Orthopedics;  Laterality: Right;  Right total shoulder arthroplasty    Social History:  Social History   Socioeconomic History  . Marital status: Married    Spouse name: Not on file  . Number of children: Not on file  . Years of education: Not on file  . Highest education level: Not on file  Occupational History  . Not on file  Tobacco Use  . Smoking status: Former Smoker  Substance and Sexual Activity  . Alcohol use: Yes    Comment: GLASS WINE OR DRINK DAILY  . Drug use: No  . Sexual activity: Not on file  Other Topics Concern  . Not on file  Social History Narrative  . Not on file   Social Determinants of Health   Financial Resource Strain:   . Difficulty of Paying Living Expenses: Not on file  Food Insecurity:   . Worried About Programme researcher, broadcasting/film/video in the Last Year: Not on file  . Ran Out of Food in the Last Year: Not on file  Transportation Needs:   . Lack of Transportation (Medical): Not on file  . Lack of Transportation (Non-Medical): Not on file  Physical Activity:   . Days of Exercise per Week: Not on file  . Minutes of Exercise per Session: Not on file  Stress:   . Feeling of Stress : Not on file  Social Connections:   . Frequency of Communication with Friends and Family: Not on file  . Frequency of Social Gatherings with Friends and Family: Not on file  . Attends Religious Services: Not on file  . Active Member of Clubs or Organizations: Not on file  . Attends Banker Meetings: Not on file  . Marital Status: Not on file  Intimate Partner Violence:   . Fear of Current or Ex-Partner: Not on  file  . Emotionally Abused: Not on file  . Physically Abused: Not on file  . Sexually Abused: Not on file    Family History: No family history on file.  Medications:   Current Outpatient Medications on File Prior to Visit  Medication Sig Dispense Refill  . aspirin EC 81 MG tablet Take 1 tablet (81 mg total) by mouth daily. Swallow whole. 30 tablet 0  . atorvastatin (LIPITOR) 80 MG tablet Take 1 tablet (80 mg total) by mouth daily. 90 tablet 0  . clopidogrel (PLAVIX) 75 MG tablet Take 1 tablet (75 mg total) by mouth daily for 21 days. Further refills should be requested from [facility] [phone] 30 tablet 0  . diphenhydrAMINE (BENADRYL) 25 MG tablet Take 50 mg by mouth at bedtime as needed for sleep.    Marland Kitchen  montelukast (SINGULAIR) 10 MG tablet Take 1 tablet by mouth at bedtime.  3  . Multiple Vitamins-Minerals (MULTIVITAMIN WITH MINERALS) tablet Take 1 tablet by mouth daily.    Marland Kitchen olmesartan (BENICAR) 40 MG tablet Take 40 mg by mouth daily.    . Omega-3 Fatty Acids (FISH OIL PO) Take 1 capsule by mouth daily.    . predniSONE (DELTASONE) 5 MG tablet Take 5 mg by mouth daily with breakfast.    . vitamin B-12 (CYANOCOBALAMIN) 1000 MCG tablet Take 1,000 mcg by mouth daily.    . vitamin E (VITAMIN E) 180 MG (400 UNITS) capsule Take 400 Units by mouth daily.     No current facility-administered medications on file prior to visit.    Allergies:  No Known Allergies    OBJECTIVE:  Physical Exam  Vitals:   07/23/20 0908  BP: 135/73  Pulse: 65  Weight: 202 lb (91.6 kg)  Height: 5\' 10"  (1.778 m)   Body mass index is 28.98 kg/m. No exam data present  No flowsheet data found.   General: well developed, well nourished,  elderly Caucasian male, seated, in no evident distress Head: head normocephalic and atraumatic.   Neck: supple with no carotid or supraclavicular bruits Cardiovascular: regular rate and rhythm, no murmurs Musculoskeletal: no deformity Skin:  no  rash/petichiae Vascular:  Normal pulses all extremities   Neurologic Exam Mental Status: Awake and fully alert.   Fluent speech and language.  Oriented to place and time. Recent and remote memory impaired. Attention span, concentration and fund of knowledge impaired. Mood and affect appropriate but quickly became upset and defensive when speaking of his cognition and visual deficits.  Cranial Nerves: Fundoscopic exam reveals sharp disc margins. Pupils equal, briskly reactive to light. Extraocular movements full without nystagmus. Visual fields show dense left homonymous hemianopia. Hearing intact. Facial sensation intact. Face, tongue, palate moves normally and symmetrically.  Motor: Normal bulk and tone. Normal strength in all tested extremity muscles. Sensory.: intact to touch , pinprick , position and vibratory sensation.  Coordination: Rapid alternating movements normal in all extremities. Finger-to-nose and heel-to-shin performed accurately bilaterally. Gait and Station: Arises from chair without difficulty. Stance is normal. Gait demonstrates normal stride length with mild unsteadiness without use of assistive device.  Moderate difficulty performing tandem gait.  Able to heel toe without difficulty. Reflexes: 1+ and symmetric. Toes downgoing.     NIHSS  2 Modified Rankin  3      ASSESSMENT: Eric Brewer is a 79 y.o. year old male presented with confusion, change in vision and vertigo on 06/16/2020 with stroke work-up revealing R PCA territory infarct, embolic secondary to unknown source.  Loop recorder placed on 06/24/2020 by Dr. 06/26/2020.  Vascular risk factors include HTN, HLD, former tobacco use and EtOH use.      PLAN:  1. R PCA stroke, cryptogenic:  a. Residual deficit: Cognitive impairment, left homonymous hemianopia and gait unsteadiness.  i.  Question unawareness of deficits vs denial especially in regards to return to driving.  He had difficulty comprehending evidence of  left peripheral loss bilaterally and that he has worsening vision post stroke compared to his baseline.  Difficulty fully assessing cognition as he became upset and defensive when this was discussed.   ii. Referral placed to outpatient SLP for further evaluation as he was not evaluated by speech therapist during admission but OT reported short Blessed test receiving score of 11 indicative of impairment consistent with dementia.   iii. He has further  evaluation with ophthalmologist on Thursday.   iv. He is not cleared to return to driving at this time due to continued visual and cognitive impairment.  May consider driving rehabilitation for further evaluation if indicated. b. Continue aspirin 81 mg daily  and atorvastatin 80 mg daily for secondary stroke prevention.  c. Advised to discontinue Plavix as long-term DAPT not indicated d.  Loop recorder has not shown atrial fibrillation thus far - will continue to be monitored by cardiology.   e. Discussed secondary stroke prevention measures and importance of close PCP follow up for aggressive stroke risk factor management  2. HTN: BP goal <130/90.  Stable today.  Managed by PCP. 3. HLD: LDL goal <70. Recent LDL 113.  Recently changed from simvastatin to atorvastatin during admission.  Recommend repeat lipid panel in 1 to 2 months with PCP for reevaluation 4. Suspected sleep apnea: Referral placed to [facility] sleep clinic for further evaluation of possible underlying sleep apnea    Follow up in 3 months or call earlier if needed   I spent 45 minutes of face-to-face and non-face-to-face time with patient and daughter.  This included previsit chart review, lab review, study review, order entry, electronic health record documentation, patient education regarding recent stroke including etiology, residual deficits, importance of managing stroke risk factors and answered all questions to patient and daughters satisfaction   Ihor Austin, [facility]  [facility] [address] [place], [place] [address]  Phone [phone] Fax [phone] Note: This document was prepared with digital dictation and possible smart phrase technology. Any transcriptional errors that result from this process are unintentional.

## 2020-07-23 NOTE — Telephone Encounter (Signed)
Called wife and advised her of NP's message. She verbalized understanding, appreciation.

## 2020-07-26 ENCOUNTER — Ambulatory Visit: Payer: Medicare Other | Admitting: Physical Therapy

## 2020-07-26 ENCOUNTER — Other Ambulatory Visit: Payer: Self-pay

## 2020-07-26 DIAGNOSIS — R2689 Other abnormalities of gait and mobility: Secondary | ICD-10-CM

## 2020-07-26 DIAGNOSIS — R262 Difficulty in walking, not elsewhere classified: Secondary | ICD-10-CM | POA: Diagnosis not present

## 2020-07-26 NOTE — Progress Notes (Signed)
I agree with the above plan 

## 2020-07-26 NOTE — Therapy (Signed)
[facility] [address] [place], [place], [address] Phone: [phone]   Fax:  [phone]  Physical Therapy Treatment  Patient Details  Name: Eric Brewer MRN: [id number] Date of Birth: 04-27-1941 Referring Provider (PT): Yetta Barre   Encounter Date: 07/26/2020   PT End of Session - 07/26/20 0943    Visit Number 5    Date for PT Re-Evaluation 09/10/20    PT Start Time 0850    PT Stop Time 0928    PT Time Calculation (min) 38 min    Activity Tolerance Patient tolerated treatment well    Behavior During Therapy [facility] for tasks assessed/performed           Past Medical History:  Diagnosis Date  . Arthritis   . COPD (chronic obstructive pulmonary disease) (HCC)    MINIMAL   . History of kidney stones     Past Surgical History:  Procedure Laterality Date  . CATARACT EXTRACTION W/ INTRAOCULAR LENS IMPLANT     RIGHT  . 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 TONSILLECTOMY    . TOTAL SHOULDER ARTHROPLASTY Right 10/25/2014   Procedure: TOTAL SHOULDER ARTHROPLASTY;  Surgeon: Mable Paris, MD;  Location: [facility] OR;  Service: Orthopedics;  Laterality: Right;  Right total shoulder arthroplasty    There were no vitals filed for this visit.   Subjective Assessment - 07/26/20 0934    Subjective Pt reports doing well this morning. He mentioned that his appointment with the neurologist went okay but he still is experiencing memory deficits.    Currently in Pain? No/denies                             [facility] Adult PT Treatment/Exercise - 07/26/20 0001      High Level Balance   High Level Balance Activities Side stepping;Backward walking;Tandem walking   airex balance beam in parallel bars    High Level Balance Comments marching, hip abd., hamstring curls, toe raises on mini trampoline. Catch on airex pad. tandem walking with trunk rot. with blue medicine ball      Knee/Hip Exercises: Standing   Walking  with Sports Cord #30 x5 ea direction, front and lateral step-ups                    PT Short Term Goals - 07/22/20 0839      PT SHORT TERM GOAL #1   Title Pt will be I with initial HEP    Time 2    Period Weeks    Status Achieved    Target Date 07/25/20             PT Long Term Goals - 07/22/20 8182      PT LONG TERM GOAL #1   Title Pt will be I with advanced HEP    Time 6    Period Weeks    Status On-going      PT LONG TERM GOAL #2   Title Pt will demo gait with increased B foot clearance and heel strike    Time 6    Period Weeks    Status On-going      PT LONG TERM GOAL #3   Title Pt will report return to gym fitness program with no increased pain or balance difficulties    Time 6    Period Weeks    Status Achieved  Plan - 07/26/20 0947    Clinical Impression Statement Patient ambulated to therapy today. Focused on higher-level balanced activites because patient has been doing strength training at the gym. Patient tolerated treatment well deomstrated by his ability to perform balance activites with minimal hand support, was able to use correct hip/ankle strategies as well as righting to prevent LOB. Verbal and tactile cuing needed throughout treatment to maintain good posture and perform exercises with proper technique.    PT Treatment/Interventions ADLs/Self Care Home Management;Gait training;Stair training;Functional mobility training;Therapeutic activities;Therapeutic exercise;Balance training;Neuromuscular re-education;Manual techniques;Patient/family education    PT Next Visit Plan Continue high-level balance activites           Patient will benefit from skilled therapeutic intervention in order to improve the following deficits and impairments:  Abnormal gait, Difficulty walking, Decreased safety awareness, Impaired perceived functional ability, Impaired vision/preception, Decreased balance, Decreased mobility  Visit  Diagnosis: Balance problem  Difficulty in walking, not elsewhere classified     Problem List Patient Active Problem List   Diagnosis Date Noted  . 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 CVA (cerebral vascular accident) (HCC) 06/16/2020  . 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 [place], [place], [address] Phone: [phone]   Fax:  [phone]  Name: Eric Brewer MRN: [id number] Date of Birth: [birth year]

## 2020-07-29 ENCOUNTER — Telehealth: Payer: Self-pay | Admitting: Adult Health

## 2020-07-29 ENCOUNTER — Ambulatory Visit (INDEPENDENT_AMBULATORY_CARE_PROVIDER_SITE_OTHER): Payer: Medicare Other

## 2020-07-29 DIAGNOSIS — I639 Cerebral infarction, unspecified: Secondary | ICD-10-CM | POA: Diagnosis not present

## 2020-07-29 NOTE — Telephone Encounter (Signed)
I called wife..  I relayed per JM/NP recommendation that even with opthamology eval would still go ahead and have the ST and sleep evaluations done.  She relayed that care everywhere notes the recc.s of opthamology  (OT).  This could be ordered as well per JM/NP.  She appreciated call.

## 2020-07-29 NOTE — Telephone Encounter (Signed)
I would still recommend he pursue speech/cognitive therapy despite optho eval. This is not for driving purposes or clearance but more for hopeful stroke recovery

## 2020-07-29 NOTE — Telephone Encounter (Signed)
Pt's wife, Vonzell Lindblad (on [place]) called, had a ophthalmologist appointment last Thursday.Opthalmologist would not release him to drive, but would like to see him in 4 months. Would you like for me to wait to schedule the speech and cognitive appointment after the ophthalmologist appointment. Please advise.

## 2020-07-30 ENCOUNTER — Other Ambulatory Visit: Payer: Self-pay

## 2020-07-30 ENCOUNTER — Encounter: Payer: Self-pay | Admitting: Physical Therapy

## 2020-07-30 ENCOUNTER — Ambulatory Visit: Payer: Medicare Other | Admitting: Physical Therapy

## 2020-07-30 DIAGNOSIS — R262 Difficulty in walking, not elsewhere classified: Secondary | ICD-10-CM | POA: Diagnosis not present

## 2020-07-30 DIAGNOSIS — R2689 Other abnormalities of gait and mobility: Secondary | ICD-10-CM

## 2020-07-30 LAB — CUP PACEART REMOTE DEVICE CHECK
Date Time Interrogation Session: 20211025130608
Implantable Pulse Generator Implant Date: 20210920

## 2020-07-30 NOTE — Therapy (Signed)
[facility] [address] [place], [place], [address] Phone: [phone]   Fax:  [phone]  Physical Therapy Treatment  Patient Details  Name: Eric Brewer MRN: [id number] Date of Birth: 27-Oct-1940 Referring Provider (PT): Yetta Barre   Encounter Date: 07/30/2020   PT End of Session - 07/30/20 1021    Visit Number 6    Date for PT Re-Evaluation 09/10/20    PT Start Time 0930    PT Stop Time 1011    PT Time Calculation (min) 41 min    Activity Tolerance Patient tolerated treatment well    Behavior During Therapy [facility] for tasks assessed/performed           Past Medical History:  Diagnosis Date  . Arthritis   . COPD (chronic obstructive pulmonary disease) (HCC)    MINIMAL   . History of kidney stones     Past Surgical History:  Procedure Laterality Date  . CATARACT EXTRACTION W/ INTRAOCULAR LENS IMPLANT     RIGHT  . 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 TONSILLECTOMY    . TOTAL SHOULDER ARTHROPLASTY Right 10/25/2014   Procedure: TOTAL SHOULDER ARTHROPLASTY;  Surgeon: Mable Paris, MD;  Location: [facility] OR;  Service: Orthopedics;  Laterality: Right;  Right total shoulder arthroplasty    There were no vitals filed for this visit.   Subjective Assessment - 07/30/20 0930    Subjective Pt reports doing good this morning. He walked to PT today.                             [facility] Adult PT Treatment/Exercise - 07/30/20 0001      High Level Balance   High Level Balance Activities Negotitating around obstacles;Negotiating over obstacles   trouble with lateral movements and dynadisc   High Level Balance Comments Multi-sequence: squat>heel raise>toe raise>hip abd.                     PT Short Term Goals - 07/22/20 0839      PT SHORT TERM GOAL #1   Title Pt will be I with initial HEP    Time 2    Period Weeks    Status Achieved    Target Date 07/25/20             PT Long  Term Goals - 07/22/20 0838      PT LONG TERM GOAL #1   Title Pt will be I with advanced HEP    Time 6    Period Weeks    Status On-going      PT LONG TERM GOAL #2   Title Pt will demo gait with increased B foot clearance and heel strike    Time 6    Period Weeks    Status On-going      PT LONG TERM GOAL #3   Title Pt will report return to gym fitness program with no increased pain or balance difficulties    Time 6    Period Weeks    Status Achieved                 Plan - 07/30/20 1022    Clinical Impression Statement Patient ambulated to PT today. Emphasis on high-level balance with the use of a multidimentional obstacle course challenging the patient's balance and coordination. He required frequent verbal cuing and min tacile cuing throughout the treatment  to maintain upright posture, to properly distribute his weight in BLE's, and to safely execute lateral movments. Rest breaks needed throughout.    PT Treatment/Interventions ADLs/Self Care Home Management;Gait training;Stair training;Functional mobility training;Therapeutic activities;Therapeutic exercise;Balance training;Neuromuscular re-education;Manual techniques;Patient/family education    PT Next Visit Plan Continue to emphasize higher-level balance activites. Work on lateral and backwards movements.           Patient will benefit from skilled therapeutic intervention in order to improve the following deficits and impairments:  Abnormal gait, Difficulty walking, Decreased safety awareness, Impaired perceived functional ability, Impaired vision/preception, Decreased balance, Decreased mobility  Visit Diagnosis: Balance problem  Difficulty in walking, not elsewhere classified     Problem List Patient Active Problem List   Diagnosis Date Noted  . 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 CVA (cerebral vascular accident) (HCC) 06/16/2020  . 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 [place], [place], [address] Phone: [phone]   Fax:  [phone]  Name: Eric Brewer MRN: [id number] Date of Birth: 03-Jul-1941

## 2020-08-01 NOTE — Progress Notes (Signed)
[facility] Summary Report / Loop Recorder 

## 2020-08-02 ENCOUNTER — Other Ambulatory Visit: Payer: Self-pay

## 2020-08-02 ENCOUNTER — Ambulatory Visit: Payer: Medicare Other | Admitting: Physical Therapy

## 2020-08-02 ENCOUNTER — Encounter: Payer: Self-pay | Admitting: Physical Therapy

## 2020-08-02 DIAGNOSIS — R262 Difficulty in walking, not elsewhere classified: Secondary | ICD-10-CM | POA: Diagnosis not present

## 2020-08-02 DIAGNOSIS — R2689 Other abnormalities of gait and mobility: Secondary | ICD-10-CM

## 2020-08-02 NOTE — Therapy (Signed)
[place]. [place], [place], [address] Phone: [phone]   Fax:  [phone]  Physical Therapy Treatment PHYSICAL THERAPY DISCHARGE SUMMARY  Visits from Start of Care: 7  Plan: Patient agrees to discharge.  Patient goals were partially met. Patient is being discharged due to being pleased with the current functional level.  ?????     Patient Details  Name: Eric Brewer MRN: [id number] Date of Birth: 1940-11-16 Referring Provider (PT): Ronnald Ramp   Encounter Date: 08/02/2020   PT End of Session - 08/02/20 0930    Visit Number 7    Date for PT Re-Evaluation 09/10/20    PT Start Time 0841    PT Stop Time 0927    PT Time Calculation (min) 46 min    Activity Tolerance Patient tolerated treatment well    Behavior During Therapy [facility] for tasks assessed/performed           Past Medical History:  Diagnosis Date   Arthritis    COPD (chronic obstructive pulmonary disease) ([place])    MINIMAL    History of kidney stones     Past Surgical History:  Procedure Laterality Date   CATARACT EXTRACTION W/ INTRAOCULAR LENS IMPLANT     RIGHT   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   Subjective Assessment - 08/02/20 0838    Subjective Pt reports doing well; walked to PT this morning. States that his main concerns are vision/memory. Does not have any problems doing what he needs to do around the house.    Currently in Pain? No/denies              [facility] PT Assessment - 08/02/20 0001      Berg Balance Test   Sit to Stand Able to stand without using hands and stabilize independently    Standing Unsupported Able to stand safely  2 minutes    Sitting with Back Unsupported but Feet Supported on Floor or Stool Able to sit safely and securely 2 minutes    Stand to Sit Sits safely with minimal use of hands    Transfers Able to transfer safely, minor use of hands    Standing Unsupported with Eyes Closed Able to stand 10 seconds safely    Standing Unsupported with Feet Together Able to place feet together independently and stand 1 minute safely    From Standing, Reach Forward with Outstretched Arm Can reach forward >12 cm safely (5")    From Standing Position, Pick up Object from Floor Able to pick up shoe safely and easily    From Standing Position, Turn to Look Behind Over each Shoulder Looks behind from both sides and weight shifts well    Turn 360 Degrees Able to turn 360 degrees safely in 4 seconds or less    Standing Unsupported, Alternately Place Feet on Step/Stool Able to stand independently and safely and complete 8 steps in 20 seconds    Standing Unsupported, One Foot in Front Able to plae foot ahead of the other independently and hold 30 seconds    Standing on One Leg Able to lift leg independently and hold equal to or more than 3  seconds    Total Score 52                         [facility] Adult PT Treatment/Exercise - 08/02/20 0001      High Level Balance   High Level Balance Activities Side stepping;Backward walking;Tandem walking    High Level Balance Comments foam sidestepping/tandem walking balance beam; marching and sidestepping on airex; rocker board forward/back       Knee/Hip Exercises: Aerobic   Nustep L5 x 6 min      Knee/Hip Exercises: Standing   Walking with Sports Cord 30# x 5 each direction      Knee/Hip Exercises: Seated   Sit to Sand 2 sets;without UE support   on airex with yellow ball raise overhead                   PT Short Term Goals - 07/22/20 0839      PT SHORT TERM GOAL #1   Title Pt will be I with initial HEP    Time 2    Period Weeks    Status  Achieved    Target Date 07/25/20             PT Long Term Goals - 08/02/20 0932      PT LONG TERM GOAL #1   Title Pt will be I with advanced HEP    Time 6    Period Weeks    Status Achieved      PT LONG TERM GOAL #2   Title Pt will demo gait with increased B foot clearance and heel strike    Time 6    Period Weeks    Status Partially Met      PT LONG TERM GOAL #3   Title Pt will report return to gym fitness program with no increased pain or balance difficulties    Time 6    Period Weeks    Status Achieved                 Plan - 08/02/20 0930    Clinical Impression Statement Pt recommended for d/c today; pt reports no physical limitations, has returned to independent gym program, and walks to clinic. Pt BERG score has improved to 52/56. Pt demos good balance with supervision assist throughout all balance activities. Does have tendency to lean posteriorly on LEs; some difficulty with rocker board and sidestepping. Educated on continuance of balance HEP and regular exercise; discussed when to return with pt VU.    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 [place], [place], [address] Phone: [phone]   Fax:  [phone]  Name: Eric Brewer MRN: [id number] Date of Birth: October 12, 1940

## 2020-08-06 ENCOUNTER — Other Ambulatory Visit: Payer: Self-pay

## 2020-08-06 ENCOUNTER — Encounter: Payer: Self-pay | Admitting: Occupational Therapy

## 2020-08-06 ENCOUNTER — Ambulatory Visit: Payer: Medicare Other | Attending: Nurse Practitioner | Admitting: Occupational Therapy

## 2020-08-06 DIAGNOSIS — I69854 Hemiplegia and hemiparesis following other cerebrovascular disease affecting left non-dominant side: Secondary | ICD-10-CM | POA: Insufficient documentation

## 2020-08-06 DIAGNOSIS — R41840 Attention and concentration deficit: Secondary | ICD-10-CM | POA: Diagnosis present

## 2020-08-06 DIAGNOSIS — R41842 Visuospatial deficit: Secondary | ICD-10-CM | POA: Diagnosis present

## 2020-08-06 DIAGNOSIS — R41841 Cognitive communication deficit: Secondary | ICD-10-CM | POA: Insufficient documentation

## 2020-08-06 DIAGNOSIS — R41844 Frontal lobe and executive function deficit: Secondary | ICD-10-CM

## 2020-08-06 DIAGNOSIS — R2689 Other abnormalities of gait and mobility: Secondary | ICD-10-CM | POA: Insufficient documentation

## 2020-08-06 DIAGNOSIS — R278 Other lack of coordination: Secondary | ICD-10-CM | POA: Insufficient documentation

## 2020-08-06 NOTE — Therapy (Signed)
[facility] [address] [place], [place], [address] Phone: [phone]   Fax:  [phone]  Occupational Therapy Evaluation  Patient Details  Name: Eric Brewer MRN: [id number] Date of Birth: [birth year] Referring Provider (OT): Dr. Pearlean Brownie   Encounter Date: 08/06/2020   OT End of Session - 08/06/20 1440    Visit Number 1    Number of Visits 9    Date for OT Re-Evaluation 10/01/20    Authorization Type U[employer] Medicare    Authorization Time Period 10th visit progress note, $35 copay    Authorization - Visit Number 1    Authorization - Number of Visits 10    Progress Note Due on Visit 10    OT Start Time 1445    OT Stop Time 1523    OT Time Calculation (min) 38 min    Activity Tolerance Patient tolerated treatment well    Behavior During Therapy [facility] for tasks assessed/performed           Past Medical History:  Diagnosis Date  . Arthritis   . COPD (chronic obstructive pulmonary disease) ([facility])    MINIMAL   . History of kidney stones     Past Surgical History:  Procedure Laterality Date  . CATARACT EXTRACTION W/ INTRAOCULAR LENS IMPLANT     RIGHT  . 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 TONSILLECTOMY    . TOTAL SHOULDER ARTHROPLASTY Right 10/25/2014   Procedure: TOTAL SHOULDER ARTHROPLASTY;  Surgeon: Mable Paris, MD;  Location: [facility] OR;  Service: Orthopedics;  Laterality: Right;  Right total shoulder arthroplasty    There were no vitals filed for this visit.   Subjective Assessment - 08/06/20 1441    Subjective  Pt is an 79 year old male that presents to OPOT s/p CVA with left peripheral visual field deficit and min left sided weakness. Pt also presents with cognitive deficits impeding ability to return to prior level of functioning. Pt reports he wants to get back to driving. Pt reports he is back to playing golf, although not as much as before, and just wants to drive around his  neighborhood to the clubhouse and golf course. Pt denies any pain. Pt presents with poor awareness into deficits and poor understanding of skills required for driving, etc.    Patient is accompanied by: Family member   Spouse   Pertinent History PMH COPD and HTN    Patient Stated Goals "I just want to be able to drive a car"    Currently in Pain? No/denies             [facility] OT Assessment - 08/06/20 1435      Assessment   Medical Diagnosis R PCA     Referring Provider (OT) Dr. Pearlean Brownie    Onset Date/Surgical Date 06/16/20    Hand Dominance Right    Prior Therapy PT in SNF      Precautions   Precautions None      Restrictions   Weight Bearing Restrictions No      Balance Screen   Has the patient fallen in the past 6 months Yes   not since the stroke     Home  Environment   Family/patient expects to be discharged to: Private residence    Type of Home House    Home Layout Bed/Bath upstairs    Bathroom Shower/Tub Walk-in Shower    Home Equipment Grab bars - tub/shower  Lives With Spouse      Prior Function   Level of Independence Independent    Vocation Retired    Leisure enjoys Scientist, research (physical sciences)golf      ADL   Tub/Shower Transfer Equipment Grab bars   having them installed   ADL comments Independent with ADLs      IADL   Shopping Assistance for transportation    Prior Level of Function [employer] House[employer] pt did not complete cleaning or laundry prior - did Soil scientistyardwork    Light Housekeeping Does not participate in any housekeeping tasks   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   Community Mobility Relies on family or friends for transportation    Medication Management Is not capable of dispensing or managing own medication    Prior Level of Function Financial Management pt complete prior    Landscape architectinancial Management Manages day-to-day purchases, but needs  help with banking, major purchases, etc.   pt's spouse is assisting more now     Written Expression   Dominant Hand Right      Vision - History   Baseline Vision Wears glasses all the time    Additional Comments does not wear with physical activity (golf)      Vision Assessment   Ocular Range of Motion Within Functional Limits    Tracking/Visual Pursuits Able to track stimulus in all quads without difficulty    Saccades Undershoots   right eye undershoots. decreased speed   Visual Fields --   left peripheral visual field deficit   Acuity Impaired - to be further tested in functional context    Diplopia Assessment --   no report of diplopia   Comment continue to assess      Cognition   Overall Cognitive Status Impaired/Different from baseline    Memory Impaired    Cognition Comments memory impaired since CVA  (STM and LTM). Pt with poor self awareness into deficits as he reports no deficits and ability to return to driving but also reports visual deficits impeding ability to drive      Observation/Other Assessments   Focus on Therapeutic Outcomes (FOTO)  86      Coordination   9 Hole Peg Test Right;Left    Right 9 Hole Peg Test 36.13    Left 9 Hole Peg Test 41.57      Perception   Perception Impaired    Comments perception of deficits impaired      ROM / Strength   AROM / PROM / Strength AROM;Strength      AROM   Overall AROM  Within functional limits for tasks performed      Strength   Overall Strength Within functional limits for tasks performed      Hand Function   Right Hand Gross Grasp Functional    Right Hand Grip (lbs) 93.2    Left Hand Gross Grasp Functional    Left Hand Grip (lbs) 96.7                           OT Education - 08/06/20 1441    Education Details Education provided on role and purpose of OT    Person(s) Educated Patient    Methods Explanation    Comprehension Verbalized understanding            OT Short Term  Goals -  08/06/20 1757      OT SHORT TERM GOAL #1   Title Pt will be independent with HEP 09/03/20    Time 4    Period Weeks    Status New    Target Date 09/03/20      OT SHORT TERM GOAL #2   Title Pt will complete tabletop scanning with 90% accuracy for increase in use of visual strategies    Time 4    Period Weeks    Status New      OT SHORT TERM GOAL #3   Title Pt will complete environmental scanning with 75% accuracy.    Time 4    Period Weeks    Status New      OT SHORT TERM GOAL #4   Title Pt will follow recipe and perform mod complex cooking task with supervision for return to prior level cooking    Time 4    Period Weeks    Status New      OT SHORT TERM GOAL #5   Title Pt will verbalize understanding of visual strategies for left peripheral visual field deficit    Time 4    Period Weeks    Status New             OT Long Term Goals - 08/06/20 1800      OT LONG TERM GOAL #1   Title Pt and spouse will verbalize understanding of return to driving recommendations.    Time 8    Period Weeks    Status New    Target Date 10/01/20      OT LONG TERM GOAL #2   Title Pt will return to prior level home management and yardwork with good safety awareness and mod I.    Time 8    Period Weeks    Status New      OT LONG TERM GOAL #3   Title Pt will perform environmental scanning with 90% accuracy with physical component in order to address return to driving skills.    Time 8    Period Weeks    Status New      OT LONG TERM GOAL #4   Title Pt will plan and execute or follow a recipe and perform mod complex cooking task with mod I for return to prior level cooking    Time 8    Period Weeks    Status New                 Plan - 08/06/20 1607    Clinical Impression Statement Pt is an 79 year old male that presents to OPOT s/p Rt PCA infarct. PMH significant for COPD and HTN. Pt presents with deficits most notable with being impaired with cognition and visual  deficits. Pt with deficits with memory and processing impeding his ability to complete ADLs and IADLs at prior level of functioning. Skilled occupational theray is recommended to target listed areas of deficit and increase independence and return to prior level of function.    OT Occupational Profile and History Problem Focused Assessment - Including review of records relating to presenting problem    Occupational performance deficits (Please refer to evaluation for details): ADL's;IADL's;Leisure    Body Structure / Function / Physical Skills ADL;Decreased knowledge of use of DME;GMC;Tone;UE functional use;Dexterity;Balance;IADL;ROM;Coordination;FMC;Decreased knowledge of precautions;Flexibility    Rehab Potential Good    Clinical Decision Making Limited treatment options, no task modification necessary    Comorbidities  Affecting Occupational Performance: None    Modification or Assistance to Complete Evaluation  No modification of tasks or assist necessary to complete eval    OT Frequency 1x / week    OT Duration 8 weeks   will discharge early if pt progresses ahead of schedule   OT Treatment/Interventions Self-care/ADL training;DME and/or AE instruction;Balance training;Therapeutic activities;Therapeutic exercise;Cognitive remediation/compensation;Neuromuscular education;Functional Mobility Training;Visual/perceptual remediation/compensation;Patient/family education;Energy conservation    Plan continue to assess vision (tabletop and environmental scanning)    Consulted and Agree with Plan of Care Patient;Family member/caregiver    Family Member Consulted spouse           Patient will benefit from skilled therapeutic intervention in order to improve the following deficits and impairments:   Body Structure / Function / Physical Skills: ADL, Decreased knowledge of use of DME, GMC, Tone, UE functional use, Dexterity, Balance, IADL, ROM, Coordination, FMC, Decreased knowledge of precautions,  Flexibility       Visit Diagnosis: Visuospatial deficit - Plan: Ot plan of care cert/re-cert  Other abnormalities of gait and mobility - Plan: Ot plan of care cert/re-cert  Other lack of coordination - Plan: Ot plan of care cert/re-cert  Attention and concentration deficit - Plan: Ot plan of care cert/re-cert  Frontal lobe and executive function deficit - Plan: Ot plan of care cert/re-cert  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 CVA (cerebral vascular accident) ([facility]) 06/16/2020  . 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

## 2020-08-14 ENCOUNTER — Encounter: Payer: Self-pay | Admitting: Occupational Therapy

## 2020-08-14 ENCOUNTER — Ambulatory Visit: Payer: Medicare Other | Admitting: Occupational Therapy

## 2020-08-14 ENCOUNTER — Other Ambulatory Visit: Payer: Self-pay

## 2020-08-14 DIAGNOSIS — R41842 Visuospatial deficit: Secondary | ICD-10-CM | POA: Diagnosis not present

## 2020-08-14 DIAGNOSIS — R41840 Attention and concentration deficit: Secondary | ICD-10-CM

## 2020-08-14 DIAGNOSIS — R278 Other lack of coordination: Secondary | ICD-10-CM

## 2020-08-14 NOTE — Therapy (Signed)
[facility] [address] [place], [place], [address] Phone: [phone]   Fax:  [phone]  Occupational Therapy Treatment  Patient Details  Name: Eric Brewer MRN: [id number] Date of Birth: December 08, 1940 Referring Provider (OT): Dr. Pearlean Brownie   Encounter Date: 08/14/2020   OT End of Session - 08/14/20 1823    Visit Number 2    Number of Visits 9    Date for OT Re-Evaluation 10/01/20    Authorization Type [employer] Medicare    Authorization Time Period 10th visit progress note, $35 copay    Authorization - Visit Number 2    Authorization - Number of Visits 10    Progress Note Due on Visit 10    OT Start Time 1145    OT Stop Time 1230    OT Time Calculation (min) 45 min    Activity Tolerance Patient tolerated treatment well    Behavior During Therapy [facility] for tasks assessed/performed           Past Medical History:  Diagnosis Date  . Arthritis   . COPD (chronic obstructive pulmonary disease) ([facility])    MINIMAL   . History of kidney stones     Past Surgical History:  Procedure Laterality Date  . CATARACT EXTRACTION W/ INTRAOCULAR LENS IMPLANT     RIGHT  . 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 TONSILLECTOMY    . TOTAL SHOULDER ARTHROPLASTY Right 10/25/2014   Procedure: TOTAL SHOULDER ARTHROPLASTY;  Surgeon: Mable Paris, MD;  Location: [facility] OR;  Service: Orthopedics;  Laterality: Right;  Right total shoulder arthroplasty    There were no vitals filed for this visit.   Subjective Assessment - 08/14/20 1817    Subjective  Patient agrees with OT goals, and is eager to return to prior level of functioning.    Pertinent History PMH COPD and HTN    Patient Stated Goals "I just want to be able to drive a car"    Currently in Pain? No/denies                        OT Treatments/Exercises (OP) - 08/14/20 0001      ADLs   ADL Comments Reviewed OT short and long term goals.          Cognitive Exercises   Other Cognitive Exercises 1 During the MVPT patient demonstrated delayed response time, and perhaps was challenged with abstract thinking.  Patient fatigued with task toward the end leading to even more delay in response.        Visual/Perceptual Exercises   Other Exercises Patient completed MVPT with 85% accuracy with increased time.  Patient with most difficulty with figure ground, and visual closure.                      OT Short Term Goals - 08/14/20 1154      OT SHORT TERM GOAL #4   Title Pt will follow recipe and perform mod complex cooking task with supervision for return to prior level cooking    Time 4    Period Weeks    Status On-going      OT SHORT TERM GOAL #5   Title Pt will verbalize understanding of visual strategies for left peripheral visual field deficit    Time 4    Period Weeks    Status On-going  OT Long Term Goals - 08/14/20 1159      OT LONG TERM GOAL #1   Title Pt and spouse will verbalize understanding of return to driving recommendations.    Time 8    Period Weeks    Status On-going      OT LONG TERM GOAL #2   Title Pt will return to prior level home management and yardwork with good safety awareness and mod I.    Time 8    Period Weeks    Status On-going      OT LONG TERM GOAL #3   Title Pt will perform environmental scanning with 90% accuracy with physical component in order to address return to driving skills.    Time 8    Period Weeks    Status On-going      OT LONG TERM GOAL #4   Title Pt will plan and execute or follow a recipe and perform mod complex cooking task with mod I for return to prior level cooking    Time 8    Period Weeks    Status On-going                 Plan - 08/14/20 1823    Clinical Impression Statement Patient eager to return to driving, but not in a hurry if unsafe.  Patient with both visual and cognitive impairments currently which impede ability to  immediately retunr to driving.    OT Occupational Profile and History Problem Focused Assessment - Including review of records relating to presenting problem    Occupational performance deficits (Please refer to evaluation for details): ADL's;IADL's;Leisure    Body Structure / Function / Physical Skills ADL;Decreased knowledge of use of DME;GMC;Tone;UE functional use;Dexterity;Balance;IADL;ROM;Coordination;FMC;Decreased knowledge of precautions;Flexibility    Rehab Potential Good    Clinical Decision Making Limited treatment options, no task modification necessary    Comorbidities Affecting Occupational Performance: None    Modification or Assistance to Complete Evaluation  No modification of tasks or assist necessary to complete eval    OT Frequency 1x / week    OT Duration 8 weeks   will discharge early if pt progresses ahead of schedule   OT Treatment/Interventions Self-care/ADL training;DME and/or AE instruction;Balance training;Therapeutic activities;Therapeutic exercise;Cognitive remediation/compensation;Neuromuscular education;Functional Mobility Training;Visual/perceptual remediation/compensation;Patient/family education;Energy conservation    Plan continue to assess vision (tabletop and environmental scanning), visual scanning with cognitive component    Consulted and Agree with Plan of Care Patient;Family member/caregiver           Patient will benefit from skilled therapeutic intervention in order to improve the following deficits and impairments:   Body Structure / Function / Physical Skills: ADL, Decreased knowledge of use of DME, GMC, Tone, UE functional use, Dexterity, Balance, IADL, ROM, Coordination, FMC, Decreased knowledge of precautions, Flexibility       Visit Diagnosis: Visuospatial deficit  Other lack of coordination  Attention and concentration deficit    Problem List Patient Active Problem List   Diagnosis Date Noted  . Cryptogenic stroke ([facility]) 06/24/2020    . CVA (cerebral vascular accident) ([facility]) 06/16/2020  . 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 [address] [place], [place], [address] Phone: [phone]   Fax:  [phone]  Name: Eric Brewer MRN: [id number] Date of Birth: [birth year]

## 2020-08-15 ENCOUNTER — Ambulatory Visit: Payer: Medicare Other | Admitting: Speech Pathology

## 2020-08-15 ENCOUNTER — Ambulatory Visit: Payer: Medicare Other

## 2020-08-15 DIAGNOSIS — R41841 Cognitive communication deficit: Secondary | ICD-10-CM

## 2020-08-15 DIAGNOSIS — R41842 Visuospatial deficit: Secondary | ICD-10-CM | POA: Diagnosis not present

## 2020-08-15 NOTE — Addendum Note (Signed)
Addended by: Arlana Lindau on: 08/15/2020 05:45 PM   Modules accepted: Orders

## 2020-08-15 NOTE — Therapy (Signed)
[facility] [address] [place], [place], [address] Phone: [phone]   Fax:  [phone]  Speech Language Pathology Evaluation  Patient Details  Name: Eric Brewer MRN: [id number] Date of Birth: April 25, 1941 Referring Provider (SLP): Ihor Austin, NP   Encounter Date: 08/15/2020   End of Session - 08/15/20 1531    Visit Number 1    Number of Visits 9    Date for SLP Re-Evaluation 11/13/20    Authorization Type UHC MCR    SLP Start Time 1020    SLP Stop Time  1115    SLP Time Calculation (min) 55 min    Activity Tolerance Patient tolerated treatment well           Past Medical History:  Diagnosis Date  . Arthritis   . COPD (chronic obstructive pulmonary disease) (HCC)    MINIMAL   . History of kidney stones     Past Surgical History:  Procedure Laterality Date  . CATARACT EXTRACTION W/ INTRAOCULAR LENS IMPLANT     RIGHT  . 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 TONSILLECTOMY    . TOTAL SHOULDER ARTHROPLASTY Right 10/25/2014   Procedure: TOTAL SHOULDER ARTHROPLASTY;  Surgeon: Mable Paris, MD;  Location: [facility] OR;  Service: Orthopedics;  Laterality: Right;  Right total shoulder arthroplasty    There were no vitals filed for this visit.   Subjective Assessment - 08/15/20 1026    Subjective "I lost some of my memory."    Currently in Pain? No/denies              SLP Evaluation [facility] - 08/15/20 1026      SLP Visit Information   SLP Received On 08/15/20    Referring Provider (SLP) Ihor Austin, NP    Onset Date 06/16/20    Medical Diagnosis R PCA CVA      Subjective   Subjective alert, pleasant    Patient/Family Stated Goal wants to drive again      General Information   HPI Eric Brewer is an 79y.o. 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      Balance Screen   Has the patient fallen in the past 6 months --   currently seeing PT     Prior Functional Status   Cognitive/Linguistic Baseline Within functional limits    Type of Home House     Lives With Spouse    Available Support Family    Vocation Retired      IT consultant   Overall Cognitive Status Impaired/Different from baseline    Area of Impairment Attention;Memory;Awareness;Safety/judgement    Current Attention Level Selective    Attention Comments difficulties with alternating attention, attention to L visual field    Memory Decreased short-term memory    Memory Comments loses items, forgets to pay bills, asks repeated questions wife has concern for LTM (forgot Laurie Panda was not alive, forgot that he had taken a memorable trip to [place] 5 years ago)    Safety/Judgement Decreased awareness of deficits    Awareness Intellectual    Awareness Comments pt feels he would be safe to drive despite left visual field cut    Attention  Alternating    Alternating Attention Impaired    Alternating Attention Impairment --   trailmaking 5/10   Memory Impaired    Memory Impairment Decreased recall of new information;Decreased short term memory;Prospective memory   recalled 50% of story details, design memory 2/6   Awareness Impaired    Awareness Impairment Intellectual impairment    Problem Solving Impaired   due to lower level deficits   Executive Function Self Monitoring;Self Correcting    Self Monitoring Impaired    Self Correcting Impaired      Auditory Comprehension   Overall Auditory Comprehension Appears within functional limits for tasks assessed   needs repetition occasional 2/2 attention     Visual Recognition/Discrimination   Discrimination Not tested      Reading  Comprehension   Reading Status Within funtional limits      Expression   Primary Mode of Expression Verbal      Verbal Expression   Overall Verbal Expression Appears within functional limits for tasks assessed      Written Expression   Dominant Hand Right    Written Expression Not tested      Oral Motor/Sensory Function   Overall Oral Motor/Sensory Function Appears within functional limits for tasks assessed      Motor Speech   Overall Motor Speech Appears within functional limits for tasks assessed      Standardized Assessments   Standardized Assessments  Cognitive Linguistic Quick Test;Other Assessment    Other Assessment On the Multifactorial Memory Questionnaire: "Memory Mistakes" Eric Brewer checked "rarely" for most memory mistakes, with the exception of "losing items." Wife felt several mistakes occur more often than "rarely"      Cognitive Linguistic Quick Test (Ages 4-69)   Attention Mild   152/215   Memory Moderate   111/185   Executive Function WNL   22/40   Language WNL   28/30   Visuospatial Skills Mild   60/105   Severity Rating Total 16    Composite Severity Rating 13.6                             SLP Short Term Goals - 08/15/20 1527      SLP SHORT TERM GOAL #1   Title Pt will demo knowledge of appropriate times to input/reference information in a memory compensation system, x 2 sessions.    Time 4    Period Weeks    Status New      SLP SHORT TERM GOAL #2   Title Pt will alternate attention and ID errors in moderately complex cognitive linguistic task with occasional min A over 2 sessions.    Time 4    Period Weeks    Status New            SLP Long Term Goals - 08/15/20 1529      SLP LONG TERM GOAL #1   Title Pt will use external aids to manage daily schedule and bills with rare min A over 2 sessions    Time 8    Period Weeks    Status New      SLP LONG TERM GOAL #2   Title pt will demonstrate awareness of errors in therapy  tasks 90% of the time with modified independence (self correction) in 2 sessions    Time 8    Period Weeks    Status New      SLP LONG TERM GOAL #3   Title  Pt will report no late/missed bills x 3 consecutive therapy sessions.    Time 8    Period Weeks    Status New            Plan - 08/15/20 1526    Clinical Impression Statement Eric Brewer presents with overall mild cognitive communication deficits s/p R PCA CVA. He scored in "mild" range of impairment for attention and visuospatial skills, and "moderate" for memory on the Cognitive Linguistic Quick Test. Patient has reduced awareness of deficits, although he does acknowledge memory impairment. Wife present and reports deficits in both short and long term memory, difficulty learning/remembering how to perform a new task. Pt has forgotten to pay a few bills on time. Pt expresses desire to return to driving, but has been told not to do so given L visual field cut. Pt overlooked symbols on his left during visual tasks today; did not initiate visual scanning. I recommend skilled ST to address cognitive communication deficits to reduce caregiver burden, improve safety awareness and quality of life.    Speech Therapy Frequency 1x /week   pt wishes to attend 1x a week   Duration 8 weeks    Treatment/Interventions Environmental controls;SLP instruction and feedback;Compensatory techniques;Cognitive reorganization;Functional tasks;Compensatory strategies;Internal/external aids;Patient/family education    Potential to Achieve Goals Fair    Consulted and Agree with Plan of Care Patient;Family member/caregiver           Patient will benefit from skilled therapeutic intervention in order to improve the following deficits and impairments:   Cognitive communication deficit    Problem List Patient Active Problem List   Diagnosis Date Noted  . 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 CVA (cerebral vascular accident) (HCC) 06/16/2020  . 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

## 2020-08-20 ENCOUNTER — Encounter: Payer: Medicare Other | Admitting: Occupational Therapy

## 2020-08-22 ENCOUNTER — Encounter: Payer: Self-pay | Admitting: Occupational Therapy

## 2020-08-22 ENCOUNTER — Other Ambulatory Visit: Payer: Self-pay

## 2020-08-22 ENCOUNTER — Ambulatory Visit: Payer: Medicare Other | Admitting: Neurology

## 2020-08-22 ENCOUNTER — Ambulatory Visit: Payer: Medicare Other | Admitting: Occupational Therapy

## 2020-08-22 ENCOUNTER — Encounter: Payer: Self-pay | Admitting: Neurology

## 2020-08-22 VITALS — BP 136/77 | HR 67 | Ht 70.0 in | Wt 202.0 lb

## 2020-08-22 DIAGNOSIS — I639 Cerebral infarction, unspecified: Secondary | ICD-10-CM | POA: Diagnosis not present

## 2020-08-22 DIAGNOSIS — Z8673 Personal history of transient ischemic attack (TIA), and cerebral infarction without residual deficits: Secondary | ICD-10-CM

## 2020-08-22 DIAGNOSIS — R41842 Visuospatial deficit: Secondary | ICD-10-CM

## 2020-08-22 DIAGNOSIS — E663 Overweight: Secondary | ICD-10-CM | POA: Diagnosis not present

## 2020-08-22 DIAGNOSIS — R0683 Snoring: Secondary | ICD-10-CM | POA: Diagnosis not present

## 2020-08-22 DIAGNOSIS — R278 Other lack of coordination: Secondary | ICD-10-CM

## 2020-08-22 DIAGNOSIS — R41840 Attention and concentration deficit: Secondary | ICD-10-CM

## 2020-08-22 DIAGNOSIS — R41844 Frontal lobe and executive function deficit: Secondary | ICD-10-CM

## 2020-08-22 NOTE — Therapy (Signed)
[facility] [address] [place], [place], [address] Phone: [phone]   Fax:  [phone]  Occupational Therapy Treatment  Patient Details  Name: Eric Brewer MRN: [id number] Date of Birth: 1941/01/12 Referring Provider (OT): Dr. Pearlean Brownie   Encounter Date: 08/22/2020   OT End of Session - 08/22/20 0935    Visit Number 3    Number of Visits 9    Authorization Type UHC Medicare    Authorization Time Period 10th visit progress note, $35 copay    Authorization - Visit Number 3    Authorization - Number of Visits 10    OT Start Time 0934    OT Stop Time 1013    OT Time Calculation (min) 39 min    Activity Tolerance Patient tolerated treatment well    Behavior During Therapy [facility] for tasks assessed/performed           Past Medical History:  Diagnosis Date  . Arthritis   . COPD (chronic obstructive pulmonary disease) (HCC)    MINIMAL   . History of kidney stones     Past Surgical History:  Procedure Laterality Date  . CATARACT EXTRACTION W/ INTRAOCULAR LENS IMPLANT     RIGHT  . 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 TONSILLECTOMY    . TOTAL SHOULDER ARTHROPLASTY Right 10/25/2014   Procedure: TOTAL SHOULDER ARTHROPLASTY;  Surgeon: Mable Paris, MD;  Location: [facility] OR;  Service: Orthopedics;  Laterality: Right;  Right total shoulder arthroplasty    There were no vitals filed for this visit.   Subjective Assessment - 08/22/20 0935    Subjective  Pt denies pain    Patient Stated Goals "I just want to be able to drive a car"    Currently in Pain? No/denies                  Treatment:environmental scanning 4/14 items missed first pass (71% accuracy), pt located remainder on second pass, he missed several items in inferior visual field. Therapist discussed importance of environmental scanning with grocery shopping and driving. Tabletop scanning 1.5 M to cross out the item that  repeats itself 4 x in the row for visual task with cognitive component pt missed 1/5 rows. Increased time and line guide used. Copying small peg design ,  Pt completed correctly with increased time required, pt was able to self correct mistakes that he made.                OT Education - 08/22/20 1012    Education Details compensatory strategies for visual deficits    Person(s) Educated Patient    Methods Explanation;Handout    Comprehension Verbalized understanding            OT Short Term Goals - 08/14/20 1154      OT SHORT TERM GOAL #4   Title Pt will follow recipe and perform mod complex cooking task with supervision for return to prior level cooking    Time 4    Period Weeks    Status On-going      OT SHORT TERM GOAL #5   Title Pt will verbalize understanding of visual strategies for left peripheral visual field deficit    Time 4    Period Weeks    Status On-going             OT Long Term Goals - 08/14/20 1159      OT LONG TERM GOAL #  1   Title Pt and spouse will verbalize understanding of return to driving recommendations.    Time 8    Period Weeks    Status On-going      OT LONG TERM GOAL #2   Title Pt will return to prior level home management and yardwork with good safety awareness and mod I.    Time 8    Period Weeks    Status On-going      OT LONG TERM GOAL #3   Title Pt will perform environmental scanning with 90% accuracy with physical component in order to address return to driving skills.    Time 8    Period Weeks    Status On-going      OT LONG TERM GOAL #4   Title Pt will plan and execute or follow a recipe and perform mod complex cooking task with mod I for return to prior level cooking    Time 8    Period Weeks    Status On-going                 Plan - 08/22/20 1009    Clinical Impression Statement Pt is progressing towards goals. He was educated regarding compensatory strategies for visual perceptual deficits.    OT  Occupational Profile and History Problem Focused Assessment - Including review of records relating to presenting problem    Occupational performance deficits (Please refer to evaluation for details): ADL's;IADL's;Leisure    Body Structure / Function / Physical Skills ADL;Decreased knowledge of use of DME;GMC;Tone;UE functional use;Dexterity;Balance;IADL;ROM;Coordination;FMC;Decreased knowledge of precautions;Flexibility    Rehab Potential Good    OT Treatment/Interventions Self-care/ADL training;DME and/or AE instruction;Balance training;Therapeutic activities;Therapeutic exercise;Cognitive remediation/compensation;Neuromuscular education;Functional Mobility Training;Visual/perceptual remediation/compensation;Patient/family education;Energy conservation    Plan environmental scanning with a cognitive component, tabletop activities requiring vision/ cognition:consider pipetree design    Consulted and Agree with Plan of Care Patient           Patient will benefit from skilled therapeutic intervention in order to improve the following deficits and impairments:   Body Structure / Function / Physical Skills: ADL, Decreased knowledge of use of DME, GMC, Tone, UE functional use, Dexterity, Balance, IADL, ROM, Coordination, FMC, Decreased knowledge of precautions, Flexibility       Visit Diagnosis: Visuospatial deficit  Other lack of coordination  Attention and concentration deficit  Frontal lobe and executive function deficit    Problem List Patient Active Problem List   Diagnosis Date Noted  . 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 CVA (cerebral vascular accident) (HCC) 06/16/2020  . 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

## 2020-08-22 NOTE — Patient Instructions (Addendum)
It was nice to meet you today.   Here is what we discussed today:    Based on your symptoms and your exam I believe you may be at risk for obstructive sleep apnea (aka OSA), and I think we should proceed with a sleep study to determine whether you do or do not have OSA and how severe it is. Even, if you have mild OSA, I may want you to consider treatment with CPAP, as treatment of even borderline or mild sleep apnea can result and improvement of symptoms such as sleep disruption, daytime sleepiness, nighttime bathroom breaks, restless leg symptoms, improvement of headache syndromes, even improved mood disorder.    As explained, an attended sleep study meaning you get to stay overnight in the sleep lab, lets [place] monitor sleep-related behaviors such as sleep talking and leg movements in sleep, in addition to monitoring for sleep apnea.  A home sleep test is a screening tool for sleep apnea only, and unfortunately does not help with any other sleep-related diagnoses.  Please remember, the long-term risks and ramifications of untreated moderate to severe obstructive sleep apnea are: increased Cardiovascular disease, including congestive heart failure, stroke, difficult to control hypertension, treatment resistant obesity, arrhythmias, especially irregular heartbeat commonly known as A. Fib. (atrial fibrillation); even type 2 diabetes has been linked to untreated OSA.   Sleep apnea can cause disruption of sleep and sleep deprivation in most cases, which, in turn, can cause recurrent headaches, problems with memory, mood, concentration, focus, and vigilance. Most people with untreated sleep apnea report excessive daytime sleepiness, which can affect their ability to drive. Please do not drive if you feel sleepy. Patients with sleep apnea can also develop difficulty initiating and maintaining sleep (aka insomnia).   Having sleep apnea may increase your risk for other sleep disorders, including involuntary  behaviors sleep such as sleep terrors, sleep talking, sleepwalking.    Having sleep apnea can also increase your risk for restless leg syndrome and leg movements at night.   Please note that untreated obstructive sleep apnea may carry additional perioperative morbidity. Patients with significant obstructive sleep apnea (typically, in the moderate to severe degree) should receive, if possible, perioperative PAP (positive airway pressure) therapy and the surgeons and particularly the anesthesiologists should be informed of the diagnosis and the severity of the sleep disordered breathing.   I understand, that you are reluctant to pursue testing at this time as you have a lot going on. When you are ready please call us to schedule sleep testing.   I will see you back as needed.

## 2020-08-22 NOTE — Progress Notes (Signed)
Subjective:    Patient ID: Eric Brewer is a 79 y.o. male.  HPI     Huston FoleySaima Francess Mullen, MD, PhD [facility] [address], [address] [address] [place], [place] [address]  Dear Shanda BumpsJessica and Janalyn ShyPramod,  I saw your patient, Franky MachoGeorge Brewer, upon your kind request, in my Sleep clinic today for initial consultation of his sleep disorder, in particular, concern for underlying obstructive sleep apnea.  The patient is accompanied by his wife today.  As you know, 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  He denies waking up with a sense of gasping for air lately but has had symptoms in the past.  His wife has not noticed any witnessed pauses while he is asleep but they do not sleep in the same bed typically and have not done so for years.  He feels that he has a lot going on currently and is not currently in favor of getting any testing done for sleep.  He reports that he does not believe he has sleep apnea.  I reviewed your office note from 07/23/2020.  His Epworth sleepiness score is 7 out of 24, fatigue severity score is 11 out of 63.  He reports a bedtime of around 8 and rise time around 6.  He has nocturia about 2-3 times per average night.  He lives with his wife, they have no pets in the house, they have 2 grown children.  They do not have a TV in the bedroom.  He denies recurrent morning headaches.  His weight has been fairly stable.  He has residual left hemianopsia since the stroke.  He is currently not driving.  He quit smoking in 2008.  He is retired and worked in Recruitment consultantthe chemical industry.  He has a son who has sleep apnea on CPAP therapy.  He drinks quite a bit of caffeine in the form of coffee, about 8 cups/day.  He drinks alcohol daily in the form of beer, 1 daily and 2  glasses of wine per day.  He reports that he has been told to reduce his alcohol consumption and this is the reduced amount.  He has a loop monitor in place since 06/24/2020.   His Past Medical History Is Significant For: Past Medical History:  Diagnosis Date  . Arthritis   . COPD (chronic obstructive pulmonary disease) (HCC)    MINIMAL   . History of kidney stones     His Past Surgical History Is Significant For: Past Surgical History:  Procedure Laterality Date  . CATARACT EXTRACTION W/ INTRAOCULAR LENS IMPLANT     RIGHT  . 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 TONSILLECTOMY    . TOTAL SHOULDER ARTHROPLASTY Right 10/25/2014   Procedure: TOTAL SHOULDER ARTHROPLASTY;  Surgeon: Mable ParisJustin William Chandler, MD;  [facility];  Service: Orthopedics;  Laterality: Right;  Right total shoulder arthroplasty    His Family History Is Significant For: Family History  Problem Relation Age of Onset  . Cancer Father   . Stroke Father   . Parkinson's disease Brother     His Social History Is Significant For: Social History   Socioeconomic History  . Marital status: Married    Spouse name: [name]  . Number of children: Not on file  . Years of education: Not on  file  . Highest education level: Bachelor's degree (e.g., BA, AB, BS)  Occupational History  . Not on file  Tobacco Use  . Smoking status: Former Games developer  . Smokeless tobacco: Never Used  . Tobacco comment: quit 10-05-06  Substance and Sexual Activity  . Alcohol use: Yes    Alcohol/week: 3.0 standard drinks    Types: 2 Glasses of wine, 1 Cans of beer per week    Comment: GLASS WINE OR DRINK DAILY  . Drug use: No  . Sexual activity: Not on file  Other Topics Concern  . Not on file  Social History Narrative   Lives with wife   Caffeine- 4-5 c daily   Social Determinants of Health   Financial Resource Strain:   . Difficulty of Paying Living Expenses: Not on file  Food Insecurity:   . Worried About Programme researcher, broadcasting/film/video  in the Last Year: Not on file  . Ran Out of Food in the Last Year: Not on file  Transportation Needs:   . Lack of Transportation (Medical): Not on file  . Lack of Transportation (Non-Medical): Not on file  Physical Activity:   . Days of Exercise per Week: Not on file  . Minutes of Exercise per Session: Not on file  Stress:   . Feeling of Stress : Not on file  Social Connections:   . Frequency of Communication with Friends and Family: Not on file  . Frequency of Social Gatherings with Friends and Family: Not on file  . Attends Religious Services: Not on file  . Active Member of Clubs or Organizations: Not on file  . Attends Banker Meetings: Not on file  . Marital Status: Not on file    His Allergies Are:  No Known Allergies:   His Current Medications Are:  Outpatient Encounter Medications as of 08/22/2020  Medication Sig  . aspirin EC 81 MG tablet Take 1 tablet (81 mg total) by mouth daily. Swallow whole.  Marland Kitchen atorvastatin (LIPITOR) 80 MG tablet Take 1 tablet (80 mg total) by mouth daily.  . diphenhydrAMINE (BENADRYL) 25 MG tablet Take 50 mg by mouth at bedtime as needed for sleep.  . montelukast (SINGULAIR) 10 MG tablet Take 1 tablet by mouth at bedtime.  . Multiple Vitamins-Minerals (MULTIVITAMIN WITH MINERALS) tablet Take 1 tablet by mouth daily.  Marland Kitchen olmesartan (BENICAR) 40 MG tablet Take 40 mg by mouth daily.  . Omega-3 Fatty Acids (FISH OIL PO) Take 1 capsule by mouth daily.  . predniSONE (DELTASONE) 5 MG tablet Take 5 mg by mouth daily with breakfast.  . vitamin B-12 (CYANOCOBALAMIN) 1000 MCG tablet Take 1,000 mcg by mouth daily.  . [DISCONTINUED] vitamin E (VITAMIN E) 180 MG (400 UNITS) capsule Take 400 Units by mouth daily.   No facility-administered encounter medications on file as of 08/22/2020.  :  Review of Systems:  Out of a complete 14 point review of systems, all are reviewed and negative with the exception of these symptoms as listed below: Review of  Systems  Neurological:       New pt here with wife for post stroke sleep consult. Epworth Sleepiness Scale 0= would never doze 1= slight chance of dozing 2= moderate chance of dozing 3= high chance of dozing  Sitting and reading:2 Watching TV:1 Sitting inactive in a public place (ex. Theater or meeting):0 As a passenger in a car for an hour without a break:1 Lying down to rest in the afternoon:2 Sitting and talking to someone:0  Sitting quietly after lunch (no alcohol):1 In a car, while stopped in traffic:0 Total:7    Objective:  Neurological Exam  Physical Exam Physical Examination:   Vitals:   08/22/20 1028  BP: 136/77  Pulse: 67    General Examination: The patient is a very pleasant 79 y.o. male in no acute distress. He appears well-developed and well-nourished and well groomed.   HEENT: Normocephalic, atraumatic, pupils are equal, round and reactive to light, extraocular tracking is good without limitation to gaze excursion or nystagmus noted. Hearing is grossly intact. Face is symmetric with normal facial animation. Speech is clear with no dysarthria noted. There is no hypophonia. There is no lip, neck/head, jaw or voice tremor. Neck is supple with full range of passive and active motion. There are no carotid bruits on auscultation. Oropharynx exam reveals: moderate mouth dryness, adequate dental hygiene with dentures in place, moderate airway crowding secondary to redundant soft palate and small airway entry, neck circumference is 17 inches.  Tongue protrudes centrally and palate elevates symmetrically.    Chest: Clear to auscultation without wheezing, rhonchi or crackles noted.  Heart: S1+S2+0, regular and normal without murmurs, rubs or gallops noted.   Abdomen: Soft, non-tender and non-distended with normal bowel sounds appreciated on auscultation.  Extremities: There is no pitting edema in the distal lower extremities bilaterally.   Skin: Warm and dry without  trophic changes noted.   Musculoskeletal: exam reveals no obvious joint deformities, tenderness or joint swelling or erythema.   Neurologically:  Mental status: The patient is awake, alert and oriented in all 4 spheres. His immediate and remote memory, attention, language skills and fund of knowledge are appropriate. There is no evidence of aphasia, agnosia, apraxia or anomia. Speech is clear with normal prosody and enunciation. Thought process is linear. Mood is normal and affect is normal.  Cranial nerves II - XII are as described above under HEENT exam.  Motor exam: Normal bulk, strength and tone is noted. There is no tremor. Fine motor skills and coordination: grossly intact.  Cerebellar testing: No dysmetria or intention tremor. There is no truncal or gait ataxia.  Sensory exam: intact to light touch in the upper and lower extremities.  Gait, station and balance: He stands easily. No veering to one side is noted. No leaning to one side is noted. Posture is age-appropriate and stance is slightly wider base, he walks slowly and cautiously, slight insecurity with turns.    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  He had a recent stroke.  Given his history and examination, he may be at risk for sleep apnea.  I explained the diagnosis of obstructive sleep apnea, its prognosis and treatment options to the patient and his wife in detail.  He is declining testing at this time, he is advised that we could pursue home sleep testing versus in lab testing as neither one of them can drive very much.  His wife can only drive during daylight hours and he is currently not able to drive.  I explained to them that pickup of home test equipment would be during the  daytime hours at their convenience.  I explained the connection between obstructive sleep apnea and irregular heartbeat as well as stroke risk.  He still is not  ready to pursue testing at this time.  He is advised to call our office when he is ready to pursue sleep testing, we can certainly pursue home sleep testing and take it from there.  We talked about potential treatment options particularly in the form of a positive airway pressure device such as CPAP or AutoPap.  He is encouraged to call us back in the sleep lab or in clinic when he is ready to schedule sleep testing.  I answered all the questions today and the patient and his wife were in agreement.  Thank you very much for allowing me to participate in the care of this nice patient. If I can be of any further assistance to you please do not hesitate to talk to me.  Sincerely,   Huston Foley, MD, PhD

## 2020-08-22 NOTE — Patient Instructions (Signed)
1. Look for the edge of objects (to the left and/or right) so that you make sure you are seeing all of an object 2. Turn your head when walking, scan from side to side, particularly in busy environments 3. Use an organized scanning pattern. scan from top to bottom, and left to right (like you are reading) 4. Double check yourself 5. Use a line guide (like a blank piece of paper) or your finger when reading 6. If necessary, place brightly colored tape at end of table or work area as a reminder to always look until you see the tape.

## 2020-08-27 ENCOUNTER — Other Ambulatory Visit: Payer: Self-pay

## 2020-08-27 ENCOUNTER — Ambulatory Visit: Payer: Medicare Other | Admitting: Occupational Therapy

## 2020-08-27 DIAGNOSIS — R278 Other lack of coordination: Secondary | ICD-10-CM

## 2020-08-27 DIAGNOSIS — R41842 Visuospatial deficit: Secondary | ICD-10-CM | POA: Diagnosis not present

## 2020-08-27 DIAGNOSIS — R41840 Attention and concentration deficit: Secondary | ICD-10-CM

## 2020-08-27 DIAGNOSIS — R41844 Frontal lobe and executive function deficit: Secondary | ICD-10-CM

## 2020-08-27 NOTE — Therapy (Signed)
[facility] [address] [place], [place], [address] Phone: [phone]   Fax:  [phone]  Occupational Therapy Treatment  Patient Details  Name: Eric Brewer MRN: [id number] Date of Birth: Nov 16, 1940 Referring Provider (OT): Dr. Pearlean Brownie   Encounter Date: 08/27/2020   OT End of Session - 08/27/20 1638    Visit Number 4    Number of Visits 9    Date for OT Re-Evaluation 10/01/20    Authorization Type [employer] Medicare    Authorization Time Period 10th visit progress note, $35 copay    Authorization - Visit Number 4    Authorization - Number of Visits 10    Progress Note Due on Visit 10    OT Start Time 1235    OT Stop Time 1315    OT Time Calculation (min) 40 min           Past Medical History:  Diagnosis Date  . Arthritis   . COPD (chronic obstructive pulmonary disease) (HCC)    MINIMAL   . History of kidney stones     Past Surgical History:  Procedure Laterality Date  . CATARACT EXTRACTION W/ INTRAOCULAR LENS IMPLANT     RIGHT  . 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 TONSILLECTOMY    . TOTAL SHOULDER ARTHROPLASTY Right 10/25/2014   Procedure: TOTAL SHOULDER ARTHROPLASTY;  Surgeon: Mable Paris, MD;  Location: [facility] OR;  Service: Orthopedics;  Laterality: Right;  Right total shoulder arthroplasty    There were no vitals filed for this visit.   Subjective Assessment - 08/27/20 1641    Subjective  Pt denies pain    Pertinent History PMH COPD and HTN    Patient Stated Goals "I just want to be able to drive a car"    Currently in Pain? No/denies                     Pipetree design for problem solving and visual perceptual skills x 2 min-mod  v.c for correct design and to organize pieces by size.  Increased time required for task. 2 M number cancellation task with increased time and 100% accuracy.              OT Short Term Goals - 08/14/20 1154      OT  SHORT TERM GOAL #4   Title Pt will follow recipe and perform mod complex cooking task with supervision for return to prior level cooking    Time 4    Period Weeks    Status On-going      OT SHORT TERM GOAL #5   Title Pt will verbalize understanding of visual strategies for left peripheral visual field deficit    Time 4    Period Weeks    Status On-going             OT Long Term Goals - 08/14/20 1159      OT LONG TERM GOAL #1   Title Pt and spouse will verbalize understanding of return to driving recommendations.    Time 8    Period Weeks    Status On-going      OT LONG TERM GOAL #2   Title Pt will return to prior level home management and yardwork with good safety awareness and mod I.    Time 8    Period Weeks    Status On-going      OT LONG TERM GOAL #3  Title Pt will perform environmental scanning with 90% accuracy with physical component in order to address return to driving skills.    Time 8    Period Weeks    Status On-going      OT LONG TERM GOAL #4   Title Pt will plan and execute or follow a recipe and perform mod complex cooking task with mod I for return to prior level cooking    Time 8    Period Weeks    Status On-going                 Plan - 08/27/20 1639    Clinical Impression Statement Pt is progressing towards goals. He requires increased time for cognitve tasks with a visual component.    OT Occupational Profile and History Problem Focused Assessment - Including review of records relating to presenting problem    Occupational performance deficits (Please refer to evaluation for details): ADL's;IADL's;Leisure    Body Structure / Function / Physical Skills ADL;Decreased knowledge of use of DME;GMC;Tone;UE functional use;Dexterity;Balance;IADL;ROM;Coordination;FMC;Decreased knowledge of precautions;Flexibility    Rehab Potential Good    OT Treatment/Interventions Self-care/ADL training;DME and/or AE instruction;Balance training;Therapeutic  activities;Therapeutic exercise;Cognitive remediation/compensation;Neuromuscular education;Functional Mobility Training;Visual/perceptual remediation/compensation;Patient/family education;Energy conservation    Plan environmental scanning with a cognitive component, tabletop activities requiring vision/ cognition    Consulted and Agree with Plan of Care Patient           Patient will benefit from skilled therapeutic intervention in order to improve the following deficits and impairments:   Body Structure / Function / Physical Skills: ADL, Decreased knowledge of use of DME, GMC, Tone, UE functional use, Dexterity, Balance, IADL, ROM, Coordination, FMC, Decreased knowledge of precautions, Flexibility       Visit Diagnosis: Visuospatial deficit  Other lack of coordination  Attention and concentration deficit  Frontal lobe and executive function deficit    Problem List Patient Active Problem List   Diagnosis Date Noted  . 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 CVA (cerebral vascular accident) (HCC) 06/16/2020  . 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 [place], [place], [address] Phone: [phone]   Fax:  [phone]  Name: Eric Brewer MRN: [id number] Date of Birth: 10-24-1940

## 2020-09-01 LAB — CUP PACEART REMOTE DEVICE CHECK
Date Time Interrogation Session: 20211127130529
Implantable Pulse Generator Implant Date: 20210920

## 2020-09-02 ENCOUNTER — Ambulatory Visit (INDEPENDENT_AMBULATORY_CARE_PROVIDER_SITE_OTHER): Payer: Medicare Other

## 2020-09-02 DIAGNOSIS — I639 Cerebral infarction, unspecified: Secondary | ICD-10-CM

## 2020-09-03 ENCOUNTER — Encounter: Payer: Self-pay | Admitting: Occupational Therapy

## 2020-09-03 ENCOUNTER — Ambulatory Visit: Payer: Medicare Other

## 2020-09-03 ENCOUNTER — Ambulatory Visit: Payer: Medicare Other | Admitting: Occupational Therapy

## 2020-09-03 ENCOUNTER — Other Ambulatory Visit: Payer: Self-pay

## 2020-09-03 DIAGNOSIS — R41841 Cognitive communication deficit: Secondary | ICD-10-CM

## 2020-09-03 DIAGNOSIS — R278 Other lack of coordination: Secondary | ICD-10-CM

## 2020-09-03 DIAGNOSIS — R41842 Visuospatial deficit: Secondary | ICD-10-CM

## 2020-09-03 DIAGNOSIS — R41840 Attention and concentration deficit: Secondary | ICD-10-CM

## 2020-09-03 NOTE — Patient Instructions (Addendum)
Memory Compensation Strategies  1. Use "WARM" strategy. W= write it down A=  associate it R=  repeat it M=  make a mental picture  2. You can keep a Glass blower/designer or a Pensions consultant. Use a 3-ring notebook with sections for the following:  calendar, important names and phone numbers, medications, doctors' names/phone numbers, "to do list"/reminders, and a section to journal what you did each day  3. Use a calendar to write appointments down with paper/pencil, or on a calendar app on your phone.  4. Write yourself a schedule for the day.  This can be placed on the calendar or in a separate section of the Memory Notebook.  Keeping a regular schedule can help memory.  5. Use medication organizer with sections for each day or morning/evening pills  You may need help loading it  6. Keep a basket, or pegboard by the door.   Place items that you need to take out with you in the basket or on the pegboard.  You may also want to include a message board for reminders.  7. Use sticky notes. Place sticky notes with reminders in a place where the task is performed.  For example:  "turn off the stove" placed by the stove, "lock the door" placed on the door at eye level, "take your medications" on the bathroom mirror or by the place where you normally take your medications  8. Use alarms/timers either on your phone or egg timers/alarm clocks.  Use while cooking to remind yourself to check on food or as a reminder to take your medicine, or as a reminder to make a call, or as a reminder to perform another task, etc.  9. Use a small tape recorder or a voice memo app to record important information and notes for yourself.

## 2020-09-03 NOTE — Patient Instructions (Signed)
Local Driver Evaluation Programs: ° °Comprehensive Evaluation: includes clinical and in vehicle behind the wheel testing by OCCUPATIONAL THERAPIST. Programs have varying levels of adaptive controls available for trial.  ° °[facility] °[address] °[place], [place]  [address] °[phone] or [phone] °http://www.driver-rehab.com °Evaluator:  Cyndee Crompton, OT/CDRS/CDI/SCDCM/Low Vision Certification ° °[facility] °[address] °[place], [place] [address] °[phone] °https://www.[facility].org/home/services/rehabilitation.aspx °Evaluators:  Shannon Sheek, OT and Jill Tucker, OT ° °[facility] [place] [facility] (ONLY SERVES VETERANS!!) °[facility] °[address] °[place], [place]  [address] °[phone] [phone] °http://www.[place].va.gov/services/Physical_Medicine_Rehabilitation_Services.asp °Evaluators:  Eric Andrews, KT; Heidi Harris, KT;  Gary Whitaker, KT (KT=kiniesotherapist) ° ° °Clinical evaluations only:  Includes clinical testing, refers to other programs or local certified driving instructor for behind the wheel testing. ° °[facility] (outpatient Rehab) °[facility] °[address] °[place], [place] [address] °[phone] for scheduling °http://www.[facility].edu/Outpatient-Rehabilitation/Neurorehabilitation-Therapy.htm °Evaluators:  Kelly Lambeth, OT; Kate Phillips, OT ° °Other area clinical evaluators available upon request including [facility], [facility] and [facility]. ° ° °    Resource List °What is a Driver Evaluation: °Your Road Ahead - A Guide to Comprehensive Driving Evaluations °http://www.thehartford.com/resources/mature-market-excellence/publications-on-aging ° °Association for Driver Rehabilitation Services - Disability and Driving Fact Sheets °http://www.aded.net/?page=510 ° °Driving after a Brain  Injury: °[employer] °http://www.biausa.org/tbims-abstracts/if-there-is-an-effective-way-to-determine-if-someone-is-ready-to-drive-after-tbi?A=SearchResult&SearchID=9495675&ObjectID=2758842&ObjectType=35 ° °Driving with Adaptive Equipment: °[facility] Process °http://www.driver-rehab.com/adaptive-equipment ° °[employer] Mobility Equipment [employer]ers [employer] °http://www.nmeda.com/ ° ° ° ° ° ° °  °

## 2020-09-03 NOTE — Therapy (Signed)
[facility] [address] [place], [place], [address] Phone: [phone]   Fax:  [phone]  Speech Language Pathology Treatment  Patient Details  Name: Eric Brewer MRN: [id number] Date of Birth: Feb 12, 1941 Referring Provider (SLP): Ihor Austin, NP   Encounter Date: 09/03/2020   End of Session - 09/03/20 1341    Visit Number 2    Number of Visits 9    Date for SLP Re-Evaluation 11/13/20    Authorization Type UHC MCR    SLP Start Time 1233    SLP Stop Time  1315    SLP Time Calculation (min) 42 min    Activity Tolerance Patient tolerated treatment well           Past Medical History:  Diagnosis Date  . Arthritis   . COPD (chronic obstructive pulmonary disease) (HCC)    MINIMAL   . History of kidney stones     Past Surgical History:  Procedure Laterality Date  . CATARACT EXTRACTION W/ INTRAOCULAR LENS IMPLANT     RIGHT  . 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 TONSILLECTOMY    . TOTAL SHOULDER ARTHROPLASTY Right 10/25/2014   Procedure: TOTAL SHOULDER ARTHROPLASTY;  Surgeon: Mable Paris, MD;  Location: [facility] OR;  Service: Orthopedics;  Laterality: Right;  Right total shoulder arthroplasty    There were no vitals filed for this visit.   Subjective Assessment - 09/03/20 1240    Subjective "She said I would need to be evaluated by a vision specialist (to be able to drive)."    Currently in Pain? No/denies                 ADULT SLP TREATMENT - 09/03/20 1241      General Information   Behavior/Cognition Alert;Cooperative;Pleasant mood      Treatment Provided   Treatment provided Cognitive-Linquistic      Cognitive-Linquistic Treatment   Treatment focused on Cognition    Skilled Treatment "I've had a hard time remembering names, and the code for my charge card." SLP used this example to highlight compensations for memory. SLP and pt went through each compensation in "pt  instructions" Pt told SLP that he is paying all bills at this time with same skills than prior to CVA and that his wife mentioning the non-payment of bills during evaluation was intentional. In a simple, novel alternating attention task today pt marked his place with his finger while writing but did not note error until he attempted to read his message at the encouragement of SLP. Pt was not able to fix his error in 30 seconds at which time it was time to go. SLP provided pt with three sheets for homework - fix the current task and do the other like it below, the basketball tickets sheet, and the golfing prices sheet. Pt denied needing anything to hold his papers together. SLP got pt's assurance he would bring back homework for next session.       Assessment / Recommendations / Plan   Plan Continue with current plan of care      Progression Toward Goals   Progression toward goals Progressing toward goals            SLP Education - 09/03/20 1341    Education Details memory compensations, pt may need to use memory compensations more than pre-CVA, attention and memory two defiicts highlighted from eval on 08-15-20    Person(s) Educated Patient  Methods Explanation;Handout    Comprehension Verbalized understanding            SLP Short Term Goals - 09/03/20 1243      SLP SHORT TERM GOAL #1   Title Pt will demo knowledge of appropriate times to input/reference information in a memory compensation system, x 2 sessions.    Time 3    Period Weeks    Status On-going      SLP SHORT TERM GOAL #2   Title Pt will alternate attention and ID errors in moderately complex cognitive linguistic task with occasional min A over 2 sessions.    Time 3    Period Weeks    Status On-going            SLP Long Term Goals - 09/03/20 1346      SLP LONG TERM GOAL #1   Title Pt will use external aids to manage daily schedule and bills with rare min A over 2 sessions    Time 7    Period Weeks    Status  On-going      SLP LONG TERM GOAL #2   Title pt will demonstrate awareness of errors in therapy tasks 90% of the time with modified independence (self correction) in 2 sessions    Time 7    Period Weeks    Status On-going      SLP LONG TERM GOAL #3   Title Pt will report no late/missed bills x 3 consecutive therapy sessions.    Time 7    Period Weeks    Status On-going            Plan - 09/03/20 1244    Clinical Impression Statement Mr. 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 He scored in "mild" range of impairment for attention and visuospatial skills, and "moderate" for memory on the Cognitive Linguistic Quick Test. Patient demonstrated reduced awareness of deficits today, as well as decr'd alternating attention, and decr'd emergent awareness. Pt told SLP today that his wife's interjection during eval stating he forgot to pay bills was intentional non-payment on his part.;  I recommend cont'd skilled ST to address cognitive communication deficits to reduce caregiver burden, improve safety awareness and quality of life.    Speech Therapy Frequency 1x /week   pt wishes to attend 1x a week   Duration 8 weeks    Treatment/Interventions Environmental controls;SLP instruction and feedback;Compensatory techniques;Cognitive reorganization;Functional tasks;Compensatory strategies;Internal/external aids;Patient/family education    Potential to Achieve Goals Fair    Consulted and Agree with Plan of Care Patient;Family member/caregiver           Patient will benefit from skilled therapeutic intervention in order to improve the following deficits and impairments:   Cognitive communication deficit    Problem List Patient Active Problem List   Diagnosis Date Noted  . 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 CVA (cerebral vascular accident) (HCC) 06/16/2020  . 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

## 2020-09-03 NOTE — Therapy (Signed)
[facility] [address] [place], [place], [address] Phone: [phone]   Fax:  [phone]  Occupational Therapy Treatment  Patient Details  Name: Eric Brewer MRN: [id number] Date of Birth: 1941/08/02 Referring Provider (OT): Dr. Talbert Cage   Encounter Date: 09/03/2020   OT End of Session - 09/03/20 1234    Visit Number 5    Number of Visits 9    Date for OT Re-Evaluation 10/01/20    Authorization Type UHC Medicare    Authorization Time Period 10th visit progress note, $35 copay    Authorization - Visit Number 5    Authorization - Number of Visits 10    Progress Note Due on Visit 10    OT Start Time 1145    OT Stop Time 1230    OT Time Calculation (min) 45 min    Activity Tolerance Patient tolerated treatment well    Behavior During Therapy WFL for tasks assessed/performed           Past Medical History:  Diagnosis Date   Arthritis    COPD (chronic obstructive pulmonary disease) ([place])    MINIMAL    History of kidney stones     Past Surgical History:  Procedure Laterality Date   CATARACT EXTRACTION W/ INTRAOCULAR LENS IMPLANT     RIGHT   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   Subjective Assessment - 09/03/20 1152    Subjective  It's my memory.  Mostly people's names.  I can remember stuff about science.  I think my vision is getting better.    Pertinent History PMH COPD and HTN    Currently in Pain? No/denies    Multiple Pain Sites No                        OT Treatments/Exercises (OP) - 09/03/20 0001      ADLs   Cooking Patient able to recount spaghetti sauce recipe he made  last night.  This is recipe he has made before, but he still follows written instructions.      Driving Discussed return to driving recommendations and gave resource for formal driving evaluation.  Patient still experiencing visual field cut in left periphery, however compensates spontaneoulsly more often now.        Visual/Perceptual Exercises   Other Exercises Environmental scanning with cognitive component.  Patient met short term goal, but needed min cueing for organization of information.  Patient continues with significant left visual field cut, however is functionally scanning spontaneously in familiar environments.                    OT Education - 09/03/20 1234    Education Details driver evaluation recommendation for formal drive eval    Person(s) Educated Patient    Methods Explanation;Handout    Comprehension Verbalized understanding;Need further instruction            OT Short Term Goals - 09/03/20 1236      OT SHORT TERM GOAL #1   Title Pt will be independent with HEP 09/03/20    Time 4    Period Weeks  Status Achieved    Target Date 09/03/20      OT SHORT TERM GOAL #2   Title Pt will complete tabletop scanning with 90% accuracy for increase in use of visual strategies    Time 4    Period Weeks    Status Achieved      OT SHORT TERM GOAL #3   Title Pt will complete environmental scanning with 75% accuracy.    Time 4    Period Weeks    Status Achieved      OT SHORT TERM GOAL #4   Status Achieved      OT SHORT TERM GOAL #5   Title Pt will verbalize understanding of visual strategies for left peripheral visual field deficit    Status Achieved             OT Long Term Goals - 09/03/20 1236      OT LONG TERM GOAL #1   Title Pt and spouse will verbalize understanding of return to driving recommendations.    Time 8    Period Weeks    Status On-going      OT LONG TERM GOAL #2   Title Pt will return to prior level home management and yardwork  with good safety awareness and mod I.    Time 8    Period Weeks    Status On-going      OT LONG TERM GOAL #3   Title Pt will perform environmental scanning with 90% accuracy with physical component in order to address return to driving skills.    Time 8    Period Weeks    Status On-going      OT LONG TERM GOAL #4   Title Pt will plan and execute or follow a recipe and perform mod complex cooking task with mod I for return to prior level cooking    Time 8    Period Weeks    Status On-going                 Plan - 09/03/20 1235    Clinical Impression Statement Patient has met short term goals, despite left visual field cut.  Patient with improved compensation for field cut.    OT Occupational Profile and History Problem Focused Assessment - Including review of records relating to presenting problem    Occupational performance deficits (Please refer to evaluation for details): ADL's;IADL's;Leisure    Body Structure / Function / Physical Skills ADL;Decreased knowledge of use of DME;GMC;Tone;UE functional use;Dexterity;Balance;IADL;ROM;Coordination;FMC;Decreased knowledge of precautions;Flexibility    Rehab Potential Good    Clinical Decision Making Limited treatment options, no task modification necessary    Comorbidities Affecting Occupational Performance: None    Modification or Assistance to Complete Evaluation  No modification of tasks or assist necessary to complete eval    OT Frequency 1x / week    OT Duration 8 weeks    OT Treatment/Interventions Self-care/ADL training;DME and/or AE instruction;Balance training;Therapeutic activities;Therapeutic exercise;Cognitive remediation/compensation;Neuromuscular education;Functional Mobility Training;Visual/perceptual remediation/compensation;Patient/family education;Energy conservation    Plan environmental scanning with a cognitive component, tabletop activities requiring vision/ cognition    OT Home Exercise Plan visual scanning     Consulted and Agree with Plan of Care Patient    Family Member Consulted --           Patient will benefit from skilled therapeutic intervention in order to improve the following deficits and impairments:   Body Structure / Function / Physical Skills: ADL, Decreased knowledge of use of  DME, GMC, Tone, UE functional use, Dexterity, Balance, IADL, ROM, Coordination, FMC, Decreased knowledge of precautions, Flexibility       Visit Diagnosis: Visuospatial deficit  Other lack of coordination  Attention and concentration deficit    Problem List Patient Active Problem List   Diagnosis Date Noted   Cryptogenic stroke ([place]) 06/24/2020   CVA (cerebral vascular accident) ([place]) 06/16/2020   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 [place], [place], [address] Phone: [phone]   Fax:  [phone]  Name: TREVYON SWOR MRN: [id number] Date of Birth: 1941/04/18

## 2020-09-06 NOTE — Progress Notes (Signed)
[facility] Summary Report / Loop Recorder 

## 2020-09-10 ENCOUNTER — Other Ambulatory Visit: Payer: Self-pay

## 2020-09-10 ENCOUNTER — Ambulatory Visit: Payer: Medicare Other | Attending: Nurse Practitioner | Admitting: Occupational Therapy

## 2020-09-10 ENCOUNTER — Encounter: Payer: Self-pay | Admitting: Occupational Therapy

## 2020-09-10 DIAGNOSIS — R278 Other lack of coordination: Secondary | ICD-10-CM | POA: Diagnosis present

## 2020-09-10 DIAGNOSIS — R41842 Visuospatial deficit: Secondary | ICD-10-CM | POA: Diagnosis not present

## 2020-09-10 DIAGNOSIS — R41840 Attention and concentration deficit: Secondary | ICD-10-CM | POA: Diagnosis present

## 2020-09-10 DIAGNOSIS — R2689 Other abnormalities of gait and mobility: Secondary | ICD-10-CM | POA: Insufficient documentation

## 2020-09-10 DIAGNOSIS — R41844 Frontal lobe and executive function deficit: Secondary | ICD-10-CM | POA: Diagnosis present

## 2020-09-10 DIAGNOSIS — R41841 Cognitive communication deficit: Secondary | ICD-10-CM | POA: Insufficient documentation

## 2020-09-10 DIAGNOSIS — I69854 Hemiplegia and hemiparesis following other cerebrovascular disease affecting left non-dominant side: Secondary | ICD-10-CM | POA: Insufficient documentation

## 2020-09-10 NOTE — Therapy (Signed)
[facility] [address] [place], [place], [address] Phone: [phone]   Fax:  [phone]  Occupational Therapy Treatment  Patient Details  Name: Eric Brewer MRN: [id number] Date of Birth: 1941-06-08 Referring Provider (OT): Dr. Pearlean Brownie   Encounter Date: 09/10/2020   OT End of Session - 09/10/20 1151    Visit Number 6    Number of Visits 9    Date for OT Re-Evaluation 10/01/20    Authorization Type UHC Medicare    Authorization Time Period 10th visit progress note, $35 copay    Authorization - Visit Number 6    Authorization - Number of Visits 10    Progress Note Due on Visit 10    OT Start Time 1151    OT Stop Time 1230    OT Time Calculation (min) 39 min    Activity Tolerance Patient tolerated treatment well    Behavior During Therapy [facility] for tasks assessed/performed           Past Medical History:  Diagnosis Date  . Arthritis   . COPD (chronic obstructive pulmonary disease) (HCC)    MINIMAL   . History of kidney stones     Past Surgical History:  Procedure Laterality Date  . CATARACT EXTRACTION W/ INTRAOCULAR LENS IMPLANT     RIGHT  . 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 TONSILLECTOMY    . TOTAL SHOULDER ARTHROPLASTY Right 10/25/2014   Procedure: TOTAL SHOULDER ARTHROPLASTY;  Surgeon: Mable Paris, MD;  Location: [facility] OR;  Service: Orthopedics;  Laterality: Right;  Right total shoulder arthroplasty    There were no vitals filed for this visit.   Subjective Assessment - 09/10/20 1152    Subjective  "I wanna know where we are trying to go. I am trying to get back to where I can drive a car"    Pertinent History PMH COPD and HTN    Patient Stated Goals "I just want to be able to drive a car"    Currently in Pain? No/denies                        OT Treatments/Exercises (OP) - 09/10/20 1159      Cognitive Exercises   Other Cognitive Exercises 1 Reading  a map with Constant Therapy with 90 % accuracy. Pt with difficulty following procedure for using iPad - required max cues. Pt with poor recall of procedures. Complete level 1 of reading map. Difficulty noted with alternating attention between map and question/answers      Visual/Perceptual Exercises   Other Exercises Environmental scanning with 93% accuracy (14/15) but forgot about physical component after about 2 feet of tossing ball between hands/same hand and ended up just carrying the ball. Same symbols level 3 on constant therapy with approx 90% accuracy. Pt required min verbal cues for attending to rules and procedure of task. and increased time                    OT Short Term Goals - 09/03/20 1236      OT SHORT TERM GOAL #1   Title Pt will be independent with HEP 09/03/20    Time 4    Period Weeks    Status Achieved    Target Date 09/03/20      OT SHORT TERM GOAL #2   Title Pt will complete tabletop scanning with 90% accuracy for increase in  use of visual strategies    Time 4    Period Weeks    Status Achieved      OT SHORT TERM GOAL #3   Title Pt will complete environmental scanning with 75% accuracy.    Time 4    Period Weeks    Status Achieved      OT SHORT TERM GOAL #4   Status Achieved      OT SHORT TERM GOAL #5   Title Pt will verbalize understanding of visual strategies for left peripheral visual field deficit    Status Achieved             OT Long Term Goals - 09/10/20 1223      OT LONG TERM GOAL #1   Title Pt and spouse will verbalize understanding of return to driving recommendations.    Time 8    Period Weeks    Status On-going      OT LONG TERM GOAL #2   Title Pt will return to prior level home management and yardwork with good safety awareness and mod I.    Time 8    Period Weeks    Status On-going      OT LONG TERM GOAL #3   Title Pt will perform environmental scanning with 90% accuracy with physical component in order to address  return to driving skills.    Time 8    Period Weeks    Status On-going   93% but forgot about physical component of tossing ball during task     OT LONG TERM GOAL #4   Title Pt will plan and execute or follow a recipe and perform mod complex cooking task with mod I for return to prior level cooking    Time 8    Period Weeks    Status On-going                 Plan - 09/10/20 1252    Clinical Impression Statement Pt is progressing towards goals. Pt is motivated and eager to get back to driving and prior level of functioning.    OT Occupational Profile and History Problem Focused Assessment - Including review of records relating to presenting problem    Occupational performance deficits (Please refer to evaluation for details): ADL's;IADL's;Leisure    Body Structure / Function / Physical Skills ADL;Decreased knowledge of use of DME;GMC;Tone;UE functional use;Dexterity;Balance;IADL;ROM;Coordination;FMC;Decreased knowledge of precautions;Flexibility    Rehab Potential Good    Clinical Decision Making Limited treatment options, no task modification necessary    Comorbidities Affecting Occupational Performance: None    Modification or Assistance to Complete Evaluation  No modification of tasks or assist necessary to complete eval    OT Frequency 1x / week    OT Duration 8 weeks    OT Treatment/Interventions Self-care/ADL training;DME and/or AE instruction;Balance training;Therapeutic activities;Therapeutic exercise;Cognitive remediation/compensation;Neuromuscular education;Functional Mobility Training;Visual/perceptual remediation/compensation;Patient/family education;Energy conservation    Plan environmental scanning with a cognitive component, tabletop activities requiring vision/ cognition    OT Home Exercise Plan visual scanning    Consulted and Agree with Plan of Care Patient           Patient will benefit from skilled therapeutic intervention in order to improve the following  deficits and impairments:   Body Structure / Function / Physical Skills: ADL, Decreased knowledge of use of DME, GMC, Tone, UE functional use, Dexterity, Balance, IADL, ROM, Coordination, FMC, Decreased knowledge of precautions, Flexibility       Visit Diagnosis: Visuospatial  deficit  Other lack of coordination  Attention and concentration deficit  Frontal lobe and executive function deficit    Problem List Patient Active Problem List   Diagnosis Date Noted  . 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 CVA (cerebral vascular accident) (HCC) 06/16/2020  . 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 [place], [place], [address] Phone: [phone]   Fax:  [phone]  Name: LAMICHAEL YOUKHANA MRN: [id number] Date of Birth: 10/04/1941

## 2020-09-12 ENCOUNTER — Other Ambulatory Visit: Payer: Self-pay | Admitting: Family Medicine

## 2020-09-13 ENCOUNTER — Ambulatory Visit: Payer: Medicare Other

## 2020-09-17 ENCOUNTER — Other Ambulatory Visit: Payer: Self-pay

## 2020-09-17 ENCOUNTER — Encounter: Payer: Self-pay | Admitting: Occupational Therapy

## 2020-09-17 ENCOUNTER — Ambulatory Visit: Payer: Medicare Other

## 2020-09-17 ENCOUNTER — Ambulatory Visit: Payer: Medicare Other | Admitting: Occupational Therapy

## 2020-09-17 DIAGNOSIS — R278 Other lack of coordination: Secondary | ICD-10-CM

## 2020-09-17 DIAGNOSIS — R41841 Cognitive communication deficit: Secondary | ICD-10-CM

## 2020-09-17 DIAGNOSIS — R41840 Attention and concentration deficit: Secondary | ICD-10-CM

## 2020-09-17 DIAGNOSIS — R41842 Visuospatial deficit: Secondary | ICD-10-CM | POA: Diagnosis not present

## 2020-09-17 DIAGNOSIS — R41844 Frontal lobe and executive function deficit: Secondary | ICD-10-CM

## 2020-09-17 NOTE — Therapy (Signed)
[facility] [address] [place], [place], [address] Phone: [phone]   Fax:  [phone]  Speech Language Pathology Treatment  Patient Details  Name: Eric Brewer MRN: [id number] Date of Birth: 09/02/1941 Referring Provider (SLP): Ihor Austin, NP   Encounter Date: 09/17/2020   End of Session - 09/17/20 1319    Visit Number 3    Number of Visits 9    Date for SLP Re-Evaluation 11/13/20    Authorization Type UHC MCR    SLP Start Time 1152    SLP Stop Time  1232    SLP Time Calculation (min) 40 min    Activity Tolerance Patient tolerated treatment well           Past Medical History:  Diagnosis Date  . Arthritis   . COPD (chronic obstructive pulmonary disease) (HCC)    MINIMAL   . History of kidney stones     Past Surgical History:  Procedure Laterality Date  . CATARACT EXTRACTION W/ INTRAOCULAR LENS IMPLANT     RIGHT  . 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 TONSILLECTOMY    . TOTAL SHOULDER ARTHROPLASTY Right 10/25/2014   Procedure: TOTAL SHOULDER ARTHROPLASTY;  Surgeon: Mable Paris, MD;  Location: [facility];  Service: Orthopedics;  Laterality: Right;  Right total shoulder arthroplasty    There were no vitals filed for this visit.   Subjective Assessment - 09/17/20 1313    Subjective Pt explaining away incident where he picked up someone else's keys at the gym when he already had his keys in his pocket and walked all the way home with them, not noticing this until he returned home with two sets of keys.    Currently in Pain? No/denies                 ADULT SLP TREATMENT - 09/17/20 1314      General Information   Behavior/Cognition Alert;Cooperative;Pleasant mood      Treatment Provided   Treatment provided Cognitive-Linquistic      Cognitive-Linquistic Treatment   Treatment focused on Cognition;Patient/family/caregiver education    Skilled Treatment Wife attends with pt  today. Pt having both long and short term memory (STM) issues - SLP educated that with CVA we would expect STM deficits; if long term then something else likely causing those - pt with premorbid memory difficulty. Addressing pt's decr'd awareness/explaining away deficits: SLP very candid with pt today about his errors he has made in previous sessions and stressed that these things likely would not have occurred premorbidly. After some encouragement to think longer about this pt agreed situation in "s" would not have occurred, or pt would have realized it prior to returning home. SLP told pt to make sure to pay atteniton to lsat sessions's homework and to bring this to next session.      Assessment / Recommendations / Plan   Plan Continue with current plan of care   adjust/increase visits and/or frequency if pt awareness improves     Progression Toward Goals   Progression toward goals Progressing toward goals            SLP Education - 09/17/20 1319    Education Details deficit areas, long term vs short term memory with CVA    Person(s) Educated Patient    Methods Explanation;Demonstration;Verbal cues    Comprehension Verbalized understanding;Verbal cues required;Need further instruction            SLP  Short Term Goals - 09/17/20 1321      SLP SHORT TERM GOAL #1   Title Pt will demo knowledge of appropriate times to input/reference information in a memory compensation system, x 2 sessions.    Time 2    Period Weeks    Status On-going      SLP SHORT TERM GOAL #2   Title Pt will alternate attention and ID errors in moderately complex cognitive linguistic task with occasional min A over 2 sessions.    Time 2    Period Weeks    Status On-going            SLP Long Term Goals - 09/17/20 1321      SLP LONG TERM GOAL #1   Title Pt will use external aids to manage daily schedule and bills with rare min A over 2 sessions    Time 6    Period Weeks    Status On-going      SLP LONG  TERM GOAL #2   Title pt will demonstrate awareness of errors in therapy tasks 90% of the time with modified independence (self correction) in 2 sessions    Time 6    Period Weeks    Status On-going      SLP LONG TERM GOAL #3   Title Pt will report no late/missed bills x 3 consecutive therapy sessions.    Time 6    Period Weeks    Status On-going            Plan - 09/17/20 1320    Clinical Impression Statement Mr. 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 He scored in "mild" range of impairment for attention and visuospatial skills, and "moderate" for memory on the Cognitive Linguistic Quick Test. Patient demonstrated reduced awareness of deficits today, as well as decr'd alternating attention, and again explained away deficits until pressed hard by SLP. I recommend cont'd skilled ST to address cognitive communication deficits to reduce caregiver burden, improve safety awareness and quality of life. If pt's awareness and/or acceptance of deficits improves pt's frequency and visit number total may increase.    Speech Therapy Frequency 1x /week   pt wishes to attend 1x a week   Duration 8 weeks    Treatment/Interventions Environmental controls;SLP instruction and feedback;Compensatory techniques;Cognitive reorganization;Functional tasks;Compensatory strategies;Internal/external aids;Patient/family education    Potential to Achieve Goals Fair    Consulted and Agree with Plan of Care Patient;Family member/caregiver           Patient will benefit from skilled therapeutic intervention in order to improve the following deficits and impairments:   Cognitive communication deficit    Problem List Patient Active Problem List   Diagnosis Date Noted  . 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 CVA (cerebral vascular accident) (HCC) 06/16/2020  . 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 [place], [place], [address] Phone: [phone]   Fax:  [phone]   Name: RAHMEL Brewer MRN: [id number] Date of Birth: May 31, 1941

## 2020-09-17 NOTE — Therapy (Signed)
[facility] [address] [place], [place], [address] Phone: [phone]   Fax:  [phone]  Occupational Therapy Treatment  Patient Details  Name: Eric Brewer MRN: [id number] Date of Birth: 04-09-1941 Referring Provider (OT): Dr. Pearlean Brownie   Encounter Date: 09/17/2020   OT End of Session - 09/17/20 1103    Visit Number 7    Number of Visits 9    Date for OT Re-Evaluation 10/01/20    Authorization Type UHC Medicare    Authorization Time Period 10th visit progress note, $35 copay    Authorization - Visit Number 7    Authorization - Number of Visits 10    Progress Note Due on Visit 10    OT Start Time 1101    OT Stop Time 1145    OT Time Calculation (min) 44 min    Activity Tolerance Patient tolerated treatment well    Behavior During Therapy [facility] for tasks assessed/performed           Past Medical History:  Diagnosis Date  . Arthritis   . COPD (chronic obstructive pulmonary disease) (HCC)    MINIMAL   . History of kidney stones     Past Surgical History:  Procedure Laterality Date  . CATARACT EXTRACTION W/ INTRAOCULAR LENS IMPLANT     RIGHT  . 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 TONSILLECTOMY    . TOTAL SHOULDER ARTHROPLASTY Right 10/25/2014   Procedure: TOTAL SHOULDER ARTHROPLASTY;  Surgeon: Mable Paris, MD;  Location: [facility] OR;  Service: Orthopedics;  Laterality: Right;  Right total shoulder arthroplasty    There were no vitals filed for this visit.   Subjective Assessment - 09/17/20 1103    Subjective  I think my peripheral vision may be getting a little bit better! - Pt denies any pain today    Pertinent History PMH COPD and HTN    Patient Stated Goals "I just want to be able to drive a car"    Currently in Pain? No/denies                        OT Treatments/Exercises (OP) - 09/17/20 1115      Cognitive Exercises   Other Cognitive Exercises 1 memory  strategies reviewed    Other Cognitive Exercises 2 writing a weekly schedule - activity required increased time and completed with 95% accuracy      Visual/Perceptual Exercises   Scanning Environmental    Scanning - Environmental 11/14 accuracy 79% all errors on left side and increased errors in superior visual field on left. Pt required increased time for activity                  OT Education - 09/17/20 1122    Education Details Issued memory compensatory strategies - see pt instructions    Person(s) Educated Patient    Methods Explanation;Handout    Comprehension Verbalized understanding;Need further instruction            OT Short Term Goals - 09/03/20 1236      OT SHORT TERM GOAL #1   Title Pt will be independent with HEP 09/03/20    Time 4    Period Weeks    Status Achieved    Target Date 09/03/20      OT SHORT TERM GOAL #2   Title Pt will complete tabletop scanning with 90% accuracy for increase in use of visual  strategies    Time 4    Period Weeks    Status Achieved      OT SHORT TERM GOAL #3   Title Pt will complete environmental scanning with 75% accuracy.    Time 4    Period Weeks    Status Achieved      OT SHORT TERM GOAL #4   Status Achieved      OT SHORT TERM GOAL #5   Title Pt will verbalize understanding of visual strategies for left peripheral visual field deficit    Status Achieved             OT Long Term Goals - 09/17/20 1116      OT LONG TERM GOAL #1   Title Pt and spouse will verbalize understanding of return to driving recommendations.    Time 8    Period Weeks    Status On-going      OT LONG TERM GOAL #2   Title Pt will return to prior level home management and yardwork with good safety awareness and mod I.    Time 8    Period Weeks    Status On-going   pt reports this is not a problem     OT LONG TERM GOAL #3   Title Pt will perform environmental scanning with 90% accuracy with physical component in order to address  return to driving skills.    Time 8    Period Weeks    Status On-going   79% environmental scanning without additional physical component to walking     OT LONG TERM GOAL #4   Title Pt will plan and execute or follow a recipe and perform mod complex cooking task with mod I for return to prior level cooking    Time 8    Period Weeks    Status On-going   pt reports this is not a problem                Plan - 09/17/20 1123    Clinical Impression Statement Pt is progressing towards goals. Pt is motivated and eager to get back to driving and prior level of functioning.    OT Occupational Profile and History Problem Focused Assessment - Including review of records relating to presenting problem    Occupational performance deficits (Please refer to evaluation for details): ADL's;IADL's;Leisure    Body Structure / Function / Physical Skills ADL;Decreased knowledge of use of DME;GMC;Tone;UE functional use;Dexterity;Balance;IADL;ROM;Coordination;FMC;Decreased knowledge of precautions;Flexibility    Rehab Potential Good    Clinical Decision Making Limited treatment options, no task modification necessary    Comorbidities Affecting Occupational Performance: None    Modification or Assistance to Complete Evaluation  No modification of tasks or assist necessary to complete eval    OT Frequency 1x / week    OT Duration 8 weeks    OT Treatment/Interventions Self-care/ADL training;DME and/or AE instruction;Balance training;Therapeutic activities;Therapeutic exercise;Cognitive remediation/compensation;Neuromuscular education;Functional Mobility Training;Visual/perceptual remediation/compensation;Patient/family education;Energy conservation    Plan environmental scanning with a cognitive component, tabletop activities requiring vision/ cognition    OT Home Exercise Plan visual scanning    Consulted and Agree with Plan of Care Patient           Patient will benefit from skilled therapeutic  intervention in order to improve the following deficits and impairments:   Body Structure / Function / Physical Skills: ADL,Decreased knowledge of use of DME,GMC,Tone,UE functional use,Dexterity,Balance,IADL,ROM,Coordination,FMC,Decreased knowledge of precautions,Flexibility       Visit Diagnosis: Visuospatial deficit  Other lack of coordination  Attention and concentration deficit  Frontal lobe and executive function deficit    Problem List Patient Active Problem List   Diagnosis Date Noted  . 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 CVA (cerebral vascular accident) (HCC) 06/16/2020  . 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 [place], [place], [address] Phone: [phone]   Fax:  [phone]  Name: HASAAN RADDE MRN: [id number] Date of Birth: 1941-02-21

## 2020-09-17 NOTE — Patient Instructions (Signed)

## 2020-09-24 ENCOUNTER — Ambulatory Visit: Payer: Medicare Other

## 2020-09-24 ENCOUNTER — Other Ambulatory Visit: Payer: Self-pay

## 2020-09-24 DIAGNOSIS — R41841 Cognitive communication deficit: Secondary | ICD-10-CM

## 2020-09-24 DIAGNOSIS — R41842 Visuospatial deficit: Secondary | ICD-10-CM | POA: Diagnosis not present

## 2020-09-24 NOTE — Therapy (Signed)
[facility] [address] [place], [place], [address] Phone: [phone]   Fax:  [phone]  Speech Language Pathology Treatment  Patient Details  Name: Eric Brewer MRN: [id number] Date of Birth: [birth year] Referring Provider (SLP): Frann Rider, NP   Encounter Date: 09/24/2020   End of Session - 09/24/20 0957    Visit Number 4    Number of Visits 9    Date for SLP Re-Evaluation 11/13/20    Authorization Type UHC MCR    SLP Start Time 0848    SLP Stop Time  0930    SLP Time Calculation (min) 42 min    Activity Tolerance Patient tolerated treatment well           Past Medical History:  Diagnosis Date  . Arthritis   . COPD (chronic obstructive pulmonary disease) (HCC)    MINIMAL   . History of kidney stones     Past Surgical History:  Procedure Laterality Date  . CATARACT EXTRACTION W/ INTRAOCULAR LENS IMPLANT     RIGHT  . 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 TONSILLECTOMY    . TOTAL SHOULDER ARTHROPLASTY Right 10/25/2014   Procedure: TOTAL SHOULDER ARTHROPLASTY;  Surgeon: Nita Sells, MD;  Location: [place];  Service: Orthopedics;  Laterality: Right;  Right total shoulder arthroplasty    There were no vitals filed for this visit.   Subjective Assessment - 09/24/20 0847    Subjective Pt began walking back to gym instead of turning for route to ST room.    Currently in Pain? No/denies                 ADULT SLP TREATMENT - 09/24/20 0850      General Information   Behavior/Cognition Alert;Cooperative;Pleasant mood      Treatment Provided   Treatment provided Cognitive-Linquistic      Cognitive-Linquistic Treatment   Treatment focused on Cognition;Patient/family/caregiver education    Skilled Treatment "I think this was probably about 8th grade math." Pt with 70% success with his answers - mod A necessary usually (2/3). In another detailed functional task pt req'd min A  occasionally. SLP believes pt awareness of deficits is beginning to improve. SLP spoke wiht pt about need for managing his own meds as pwife is managing medication box for him at this time. SLP told pt that med box is a good cognitive task for attention to detail. Told pt he should fill box with wife watching him to ensure accuracy. On the way out pt began walking towards gym instead of towards checkout until SLP redirected him.      Assessment / Recommendations / Plan   Plan Continue with current plan of care      Progression Toward Goals   Progression toward goals Progressing toward goals            SLP Education - 09/24/20 0956    Education Details reduced attention to detail present with pt post-CVA    Person(s) Educated Patient    Methods Explanation            SLP Short Term Goals - 09/24/20 0957      SLP SHORT TERM GOAL #1   Title Pt will demo knowledge of appropriate times to input/reference information in a memory compensation system, x 2 sessions.    Time --    Period --    Status Not Met   decr'd awareness negatively impacted goal progress  SLP SHORT TERM GOAL #2   Title Pt will alternate attention and ID errors in moderately complex cognitive linguistic task with occasional min A over 2 sessions.    Time --    Period --    Status Not Met   decr'd awareness negatively impacted goal progress           SLP Long Term Goals - 09/24/20 1001      SLP LONG TERM GOAL #1   Title Pt will use external aids to manage daily schedule and bills with rare min A over 2 sessions    Time 5    Period Weeks    Status On-going      SLP LONG TERM GOAL #2   Title pt will demonstrate awareness of errors in therapy tasks 90% of the time with modified independence (self correction) in 2 sessions    Time 5    Period Weeks    Status On-going      SLP LONG TERM GOAL #3   Title Pt will report no late/missed bills x 3 consecutive therapy sessions.    Time 5    Period Weeks     Status On-going            Plan - 09/24/20 0957    Clinical Impression Statement Mr. Roorda presents with overall mild cognitive communication deficits s/p R PCA CVA. He scored in "mild" range of impairment for attention and visuospatial skills, and "moderate" for memory on the Cognitive Linguistic Quick Test. Patient awareness of deficits is slowly improving, however pt has not, to date, confirmed to SLP he is having deficits post-CVA. Pt with evidence of decr'd skills with alternating attention. I recommend cont'd skilled ST to address cognitive communication deficits to reduce caregiver burden, improve safety awareness and quality of life. If pt's awareness and/or acceptance of deficits improves pt's frequency and visit number total may increase.    Speech Therapy Frequency 1x /week   pt wishes to attend 1x a week   Duration 8 weeks    Treatment/Interventions Environmental controls;SLP instruction and feedback;Compensatory techniques;Cognitive reorganization;Functional tasks;Compensatory strategies;Internal/external aids;Patient/family education    Potential to Achieve Goals Fair    Consulted and Agree with Plan of Care Patient;Family member/caregiver           Patient will benefit from skilled therapeutic intervention in order to improve the following deficits and impairments:   Cognitive communication deficit    Problem List Patient Active Problem List   Diagnosis Date Noted  . Cryptogenic stroke ([place]) 06/24/2020  . CVA (cerebral vascular accident) ([place]) 06/16/2020  . 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 [place], [place], [address] Phone: [phone]   Fax:  [phone]   Name: Eric Brewer MRN: [id number] Date of Birth: 1941/06/05

## 2020-09-24 NOTE — Patient Instructions (Signed)
  Please complete the assigned speech therapy homework prior to your next session and return it to the speech therapist at your next visit.  

## 2020-09-25 ENCOUNTER — Encounter: Payer: Medicare Other | Admitting: Occupational Therapy

## 2020-09-30 ENCOUNTER — Ambulatory Visit: Payer: Medicare Other | Admitting: Occupational Therapy

## 2020-09-30 ENCOUNTER — Ambulatory Visit: Payer: Medicare Other

## 2020-09-30 ENCOUNTER — Other Ambulatory Visit: Payer: Self-pay

## 2020-09-30 ENCOUNTER — Encounter: Payer: Self-pay | Admitting: Occupational Therapy

## 2020-09-30 DIAGNOSIS — R41841 Cognitive communication deficit: Secondary | ICD-10-CM

## 2020-09-30 DIAGNOSIS — R41844 Frontal lobe and executive function deficit: Secondary | ICD-10-CM

## 2020-09-30 DIAGNOSIS — R278 Other lack of coordination: Secondary | ICD-10-CM

## 2020-09-30 DIAGNOSIS — R41842 Visuospatial deficit: Secondary | ICD-10-CM

## 2020-09-30 DIAGNOSIS — I69854 Hemiplegia and hemiparesis following other cerebrovascular disease affecting left non-dominant side: Secondary | ICD-10-CM

## 2020-09-30 DIAGNOSIS — R41840 Attention and concentration deficit: Secondary | ICD-10-CM

## 2020-09-30 DIAGNOSIS — R2689 Other abnormalities of gait and mobility: Secondary | ICD-10-CM

## 2020-09-30 NOTE — Therapy (Signed)
[facility] [address] [place], [place], [address] Phone: [phone]   Fax:  [phone]  Speech Language Pathology Treatment  Patient Details  Name: Eric Brewer MRN: [id number] Date of Birth: 31-Jan-1941 Referring Provider (SLP): Frann Rider, NP   Encounter Date: 09/30/2020   End of Session - 09/30/20 1211    Visit Number 5    Number of Visits 9    Date for SLP Re-Evaluation 11/13/20    Authorization Type UHC MCR    SLP Start Time 0867    SLP Stop Time  1145    SLP Time Calculation (min) 40 min    Activity Tolerance Patient tolerated treatment well           Past Medical History:  Diagnosis Date   Arthritis    COPD (chronic obstructive pulmonary disease) ([place])    MINIMAL    History of kidney stones     Past Surgical History:  Procedure Laterality Date   CATARACT EXTRACTION W/ INTRAOCULAR LENS IMPLANT     RIGHT   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   Subjective Assessment - 09/30/20 1112    Subjective "Went to [place] to see family."    Currently in Pain? No/denies                 ADULT SLP TREATMENT - 09/30/20 1113      General Information   Behavior/Cognition Alert;Cooperative;Pleasant mood      Treatment Provided   Treatment provided Cognitive-Linquistic      Cognitive-Linquistic Treatment   Treatment focused on Cognition;Patient/family/caregiver education    Skilled Treatment Pt states he's never been good at putting numbers into a calculator. He did not complete one question on the homework and did not notice until SLP showed him. SLP worked with pt on attention to details with  NFL scores from yesterday (look for a score for a certain game x4) and average time to answer was 18 seconds. With comparison questions x2 (e.g, "Which was the closest game yesterday?") pt average time to answer was 24 seconds. SLP again explained rationale for this defiict to pt and he told SLP he endorsed slower thinking than prior to CVA.      Assessment / Recommendations / Plan   Plan Continue with current plan of care      Progression Toward Goals   Progression toward goals Progressing toward goals            SLP Education - 09/30/20 1211    Education Details reason for deficits    Person(s) Educated Patient    Methods Explanation    Comprehension Verbalized understanding            SLP Short Term Goals - 09/24/20 0957      SLP SHORT TERM GOAL #1   Title Pt will demo knowledge of appropriate times to input/reference information in a memory compensation system, x 2 sessions.    Time --    Period --    Status Not Met   decr'd awareness negatively impacted goal progress     SLP SHORT TERM GOAL #2   Title Pt will alternate attention and ID  errors in moderately complex cognitive linguistic task with occasional min A over 2 sessions.    Time --    Period --    Status Not Met   decr'd awareness negatively impacted goal progress           SLP Long Term Goals - 09/30/20 1213      SLP LONG TERM GOAL #1   Title Pt will use external aids to manage daily schedule and bills with rare min A over 2 sessions    Time 4    Period Weeks    Status On-going      SLP LONG TERM GOAL #2   Title pt will demonstrate awareness of errors in therapy tasks 90% of the time with modified independence (self correction) in 2 sessions    Time 4    Period Weeks    Status On-going      SLP LONG TERM GOAL #3   Title Pt will report no late/missed bills x 3 consecutive therapy sessions.    Time 4    Period Weeks    Status On-going            Plan - 09/30/20 1212    Clinical Impression  Statement Shanon Brow presents with overall mild cognitive communication deficits s/p R PCA CVA. He scored in "mild" range of impairment for attention and visuospatial skills, and "moderate" for memory on the Cognitive Linguistic Quick Test. Patient awareness of deficits is slowly improving, and pt told SLP for the first time that he endorsed his thinking is slower post CVA. I recommend cont'd skilled ST to address cognitive communication deficits to reduce caregiver burden, improve safety awareness and quality of life. If pt's awareness and/or acceptance of deficits improves pt's frequency and visit number total may increase.    Speech Therapy Frequency 1x /week   pt wishes to attend 1x a week   Duration 8 weeks    Treatment/Interventions Environmental controls;SLP instruction and feedback;Compensatory techniques;Cognitive reorganization;Functional tasks;Compensatory strategies;Internal/external aids;Patient/family education    Potential to Achieve Goals Fair    Consulted and Agree with Plan of Care Patient;Family member/caregiver           Patient will benefit from skilled therapeutic intervention in order to improve the following deficits and impairments:   Cognitive communication deficit    Problem List Patient Active Problem List   Diagnosis Date Noted   Cryptogenic stroke ([place]) 06/24/2020   CVA (cerebral vascular accident) ([place]) 06/16/2020   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 ,MS, [place]  09/30/2020, 12:14 PM  [facility] [address] [place], [place], [address] Phone: [phone]   Fax:  [phone]   Name: ACESON LABELL MRN: [id number] Date of Birth: 1940/12/02

## 2020-09-30 NOTE — Therapy (Signed)
[facility] [address] [place], [place], [address] Phone: [phone]   Fax:  [phone]  Occupational Therapy Treatment  Patient Details  Name: Eric Brewer MRN: [id number] Date of Birth: 1941/06/17 Referring Provider (OT): Dr. Talbert Cage   Encounter Date: 09/30/2020   OT End of Session - 09/30/20 1016    Visit Number 8    Number of Visits 9    Date for OT Re-Evaluation 10/01/20    Authorization Type U[employer] Medicare    Authorization Time Period 10th visit progress note, $35 copay    Authorization - Visit Number 8    Authorization - Number of Visits 10    Progress Note Due on Visit 10    OT Start Time 1016    OT Stop Time 1100    OT Time Calculation (min) 44 min    Activity Tolerance Patient tolerated treatment well    Behavior During Therapy [facility] for tasks assessed/performed           Past Medical History:  Diagnosis Date  . Arthritis   . COPD (chronic obstructive pulmonary disease) (HCC)    MINIMAL   . History of kidney stones     Past Surgical History:  Procedure Laterality Date  . CATARACT EXTRACTION W/ INTRAOCULAR LENS IMPLANT     RIGHT  . 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 TONSILLECTOMY    . TOTAL SHOULDER ARTHROPLASTY Right 10/25/2014   Procedure: TOTAL SHOULDER ARTHROPLASTY;  Surgeon: Nita Sells, MD;  Location: [facility];  Service: Orthopedics;  Laterality: Right;  Right total shoulder arthroplasty    There were no vitals filed for this visit.   Subjective Assessment - 09/30/20 1017    Subjective  I still have trouble with my lateral vision but I think it's getting better.    Pertinent History PMH COPD and HTN    Patient Stated Goals "I just want to be able to drive a car"    Currently in Pain? No/denies                        OT Treatments/Exercises (OP) - 09/30/20 1021      ADLs   ADL Comments education with pt and spouse regarding discharge vs  renewal. Pt with continued deficits with visual scanning and attention to left and overall higher level problem solving impeding ability to return to driving. Verbalized understanding      Visual/Perceptual Exercises   Copy this Image PVC    PVC organized pieces with min verbal cues. Pt did not attend to details accurately and used "B" pipes instead of "C" pipes. Pt self-corrected one but required moderate verbal cues to correct all the other mistakes. Min verbal cues for attending to left side of materials as he missed some pieces on left side.    Other Exercises Environemntal scanning while tossing ball in hands. Pt found 15/15 cards with 100% accuracy. verbal cues for continuing physical component of tossing ball.                    OT Short Term Goals - 09/03/20 1236      OT SHORT TERM GOAL #1   Title Pt will be independent with HEP 09/03/20    Time 4    Period Weeks    Status Achieved    Target Date 09/03/20      OT SHORT TERM GOAL #2  Title Pt will complete tabletop scanning with 90% accuracy for increase in use of visual strategies    Time 4    Period Weeks    Status Achieved      OT SHORT TERM GOAL #3   Title Pt will complete environmental scanning with 75% accuracy.    Time 4    Period Weeks    Status Achieved      OT SHORT TERM GOAL #4   Status Achieved      OT SHORT TERM GOAL #5   Title Pt will verbalize understanding of visual strategies for left peripheral visual field deficit    Status Achieved             OT Long Term Goals - 09/30/20 1022      OT LONG TERM GOAL #1   Title Pt and spouse will verbalize understanding of return to driving recommendations.    Time 8    Period Weeks    Status Achieved      OT LONG TERM GOAL #2   Title Pt will return to prior level home management and yardwork with good safety awareness and mod I.    Time 8    Period Weeks    Status Achieved   pt reports this is not a problem     OT LONG TERM GOAL #3    Title Pt will perform environmental scanning with 90% accuracy with physical component in order to address return to driving skills.    Time 8    Period Weeks    Status Achieved      OT LONG TERM GOAL #4   Title Pt will plan and execute or follow a recipe and perform mod complex cooking task with mod I for return to prior level cooking    Time 8    Period Weeks    Status On-going   pt reports this is not a problem                Plan - 09/30/20 1022    Clinical Impression Statement Pt has met all STGs and is working towards meeting [place]. He has reported completing and doing most of them on his own but spouse not present for verification.    OT Occupational Profile and History Problem Focused Assessment - Including review of records relating to presenting problem    Occupational performance deficits (Please refer to evaluation for details): ADL's;IADL's;Leisure    Body Structure / Function / Physical Skills ADL;Decreased knowledge of use of DME;GMC;Tone;UE functional use;Dexterity;Balance;IADL;ROM;Coordination;FMC;Decreased knowledge of precautions;Flexibility    Rehab Potential Good    Clinical Decision Making Limited treatment options, no task modification necessary    Comorbidities Affecting Occupational Performance: None    Modification or Assistance to Complete Evaluation  No modification of tasks or assist necessary to complete eval    OT Frequency 1x / week    OT Duration 8 weeks    OT Treatment/Interventions Self-care/ADL training;DME and/or AE instruction;Balance training;Therapeutic activities;Therapeutic exercise;Cognitive remediation/compensation;Neuromuscular education;Functional Mobility Training;Visual/perceptual remediation/compensation;Patient/family education;Energy conservation    Plan cooking with instructions, renewal vs discharge next session, review visual strategies    OT Home Exercise Plan visual scanning    Consulted and Agree with Plan of Care Patient            Patient will benefit from skilled therapeutic intervention in order to improve the following deficits and impairments:   Body Structure / Function / Physical Skills: ADL,Decreased knowledge of use of DME,GMC,Tone,UE functional  use,Dexterity,Balance,IADL,ROM,Coordination,FMC,Decreased knowledge of precautions,Flexibility       Visit Diagnosis: Visuospatial deficit  Other lack of coordination  Attention and concentration deficit  Frontal lobe and executive function deficit  Other abnormalities of gait and mobility  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 Cryptogenic stroke ([place]) 06/24/2020  . CVA (cerebral vascular accident) ([place]) 06/16/2020  . 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 [place], [place], [address] Phone: [phone]   Fax:  [phone]  Name: Eric Brewer MRN: [id number] Date of Birth: 12-31-1940

## 2020-09-30 NOTE — Patient Instructions (Signed)
  Please complete the assigned speech therapy homework prior to your next session and return it to the speech therapist at your next visit.  

## 2020-10-07 ENCOUNTER — Ambulatory Visit (INDEPENDENT_AMBULATORY_CARE_PROVIDER_SITE_OTHER): Payer: Medicare Other

## 2020-10-07 DIAGNOSIS — I63431 Cerebral infarction due to embolism of right posterior cerebral artery: Secondary | ICD-10-CM

## 2020-10-08 LAB — CUP PACEART REMOTE DEVICE CHECK
Date Time Interrogation Session: 20211230130436
Implantable Pulse Generator Implant Date: 20210920

## 2020-10-11 ENCOUNTER — Ambulatory Visit: Payer: Medicare Other | Attending: Nurse Practitioner

## 2020-10-11 ENCOUNTER — Ambulatory Visit: Payer: Medicare Other | Admitting: Occupational Therapy

## 2020-10-11 ENCOUNTER — Other Ambulatory Visit: Payer: Self-pay

## 2020-10-11 DIAGNOSIS — R41844 Frontal lobe and executive function deficit: Secondary | ICD-10-CM | POA: Diagnosis present

## 2020-10-11 DIAGNOSIS — R41841 Cognitive communication deficit: Secondary | ICD-10-CM | POA: Insufficient documentation

## 2020-10-11 DIAGNOSIS — I69854 Hemiplegia and hemiparesis following other cerebrovascular disease affecting left non-dominant side: Secondary | ICD-10-CM | POA: Insufficient documentation

## 2020-10-11 DIAGNOSIS — R41840 Attention and concentration deficit: Secondary | ICD-10-CM | POA: Diagnosis present

## 2020-10-11 DIAGNOSIS — R278 Other lack of coordination: Secondary | ICD-10-CM | POA: Insufficient documentation

## 2020-10-11 DIAGNOSIS — R41842 Visuospatial deficit: Secondary | ICD-10-CM | POA: Diagnosis present

## 2020-10-11 DIAGNOSIS — R2689 Other abnormalities of gait and mobility: Secondary | ICD-10-CM | POA: Insufficient documentation

## 2020-10-11 NOTE — Patient Instructions (Signed)
    Please complete the assigned speech therapy homework (double checking all of this last  homework) prior to your next session and return it to the speech therapist at your next visit.

## 2020-10-12 NOTE — Therapy (Signed)
[facility] [address] [place], [place], [address] Phone: [phone]   Fax:  [phone]  Speech Language Pathology Treatment  Patient Details  Name: Eric Brewer MRN: [id number] Date of Birth: 23-Jul-1941 Referring Provider (SLP): Frann Rider, NP   Encounter Date: 10/11/2020   End of Session - 10/11/20 1424    Visit Number 6    Number of Visits 9    Date for SLP Re-Evaluation 11/13/20    Authorization Type UHC MCR    SLP Start Time 0935    SLP Stop Time  1017    SLP Time Calculation (min) 42 min    Activity Tolerance Patient tolerated treatment well           Past Medical History:  Diagnosis Date  . Arthritis   . COPD (chronic obstructive pulmonary disease) (HCC)    MINIMAL   . History of kidney stones     Past Surgical History:  Procedure Laterality Date  . CATARACT EXTRACTION W/ INTRAOCULAR LENS IMPLANT     RIGHT  . 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 TONSILLECTOMY    . TOTAL SHOULDER ARTHROPLASTY Right 10/25/2014   Procedure: TOTAL SHOULDER ARTHROPLASTY;  Surgeon: Nita Sells, MD;  Location: [facility];  Service: Orthopedics;  Laterality: Right;  Right total shoulder arthroplasty    There were no vitals filed for this visit.   Subjective Assessment - 10/11/20 0942    Subjective "They organized this paper to kind of fool you." "I didn't start this unitl the other day - I should have spent more time with it."    Currently in Pain? No/denies                 ADULT SLP TREATMENT - 10/11/20 0951      General Information   Behavior/Cognition Alert;Cooperative;Pleasant mood      Treatment Provided   Treatment provided Cognitive-Linquistic      Cognitive-Linquistic Treatment   Treatment focused on Cognition;Patient/family/caregiver education    Skilled Treatment Pt with 50% success in first 10 responses of detailed information. SLP highligted to pt that this is due to pt's  CVA. Pt demonstrated frustration at his inability to "do something so simple". SLP shared that pt's attention (alternating) attention is now difficult for him. Pt noted to lose attention in the time in took him to pivot from the information back down to the question again. SLP assisted pt with some written compensations like checkmarks and crossing out information. "I thought it wouldn't be as challenging as it was - I didn't spend enough time on it." When pt was notified his next appointment was his last, SLP gave pt the option of scheduling more ST and pt declined.      Assessment / Recommendations / Plan   Plan Continue with current plan of care      Progression Toward Goals   Progression toward goals Progressing toward goals            SLP Education - 10/11/20 1355    Education Details deficit areas are present because of the CVA, need to practice these deficit areas    Person(s) Educated Patient    Methods Explanation    Comprehension Verbalized understanding;Need further instruction            SLP Short Term Goals - 10/11/20 1452      SLP SHORT TERM GOAL #1   Title Pt will demo knowledge of appropriate  times to input/reference information in a memory compensation system, x 2 sessions.    Status Not Met   decr'd awareness negatively impacted goal progress     SLP SHORT TERM GOAL #2   Title Pt will alternate attention and ID errors in moderately complex cognitive linguistic task with occasional min A over 2 sessions.    Status Not Met   decr'd awareness negatively impacted goal progress           SLP Long Term Goals - 09/30/20 1213      SLP LONG TERM GOAL #1   Title Pt will use external aids to manage daily schedule and bills with rare min A over 2 sessions    Time 4    Period Weeks    Status On-going      SLP LONG TERM GOAL #2   Title pt will demonstrate awareness of errors in therapy tasks 90% of the time with modified independence (self correction) in 2 sessions     Time 4    Period Weeks    Status On-going      SLP LONG TERM GOAL #3   Title Pt will report no late/missed bills x 3 consecutive therapy sessions.    Time 4    Period Weeks    Status On-going            Plan - 10/11/20 1450    Clinical Impression Statement Eric Brewer presents with overall mild cognitive communication deficits s/p R PCA CVA. He scored in "mild" range of impairment for attention and visuospatial skills, and "moderate" for memory on the Cognitive Linguistic Quick Test. Patient awareness of deficits is slowly improving. SLP again had to remind pt today that skills are decreased due to CVA and that pt needs to practice those skills which are now difficult - that is the reason for coming and for the homework.  I recommend cont'd skilled ST to address cognitive communication deficits to reduce caregiver burden, improve safety awareness and quality of life. If pt's awareness and/or acceptance of deficits improves pt's frequency and visit number total may increase. However, when given the option to schedule more ST visits past next week, pt declined.   Speech Therapy Frequency 1x /week   pt wishes to attend 1x a week   Duration 8 weeks    Treatment/Interventions Environmental controls;SLP instruction and feedback;Compensatory techniques;Cognitive reorganization;Functional tasks;Compensatory strategies;Internal/external aids;Patient/family education    Potential to Achieve Goals Fair    Consulted and Agree with Plan of Care Patient;Family member/caregiver           Patient will benefit from skilled therapeutic intervention in order to improve the following deficits and impairments:   Cognitive communication deficit    Problem List Patient Active Problem List   Diagnosis Date Noted  . 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 CVA (cerebral vascular accident) ([place]) 06/16/2020  . 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 [place], [place], [address] Phone: [phone]   Fax:  [phone]   Name: Eric Brewer MRN: [id number] Date of Birth: 1941/01/06

## 2020-10-15 ENCOUNTER — Encounter: Payer: Self-pay | Admitting: Occupational Therapy

## 2020-10-15 ENCOUNTER — Ambulatory Visit: Payer: Medicare Other

## 2020-10-15 ENCOUNTER — Other Ambulatory Visit: Payer: Self-pay

## 2020-10-15 ENCOUNTER — Ambulatory Visit: Payer: Medicare Other | Admitting: Occupational Therapy

## 2020-10-15 DIAGNOSIS — R278 Other lack of coordination: Secondary | ICD-10-CM

## 2020-10-15 DIAGNOSIS — R41842 Visuospatial deficit: Secondary | ICD-10-CM

## 2020-10-15 DIAGNOSIS — R2689 Other abnormalities of gait and mobility: Secondary | ICD-10-CM

## 2020-10-15 DIAGNOSIS — R41844 Frontal lobe and executive function deficit: Secondary | ICD-10-CM

## 2020-10-15 DIAGNOSIS — R41840 Attention and concentration deficit: Secondary | ICD-10-CM

## 2020-10-15 DIAGNOSIS — R41841 Cognitive communication deficit: Secondary | ICD-10-CM | POA: Diagnosis not present

## 2020-10-15 DIAGNOSIS — I69854 Hemiplegia and hemiparesis following other cerebrovascular disease affecting left non-dominant side: Secondary | ICD-10-CM

## 2020-10-15 NOTE — Therapy (Signed)
[facility] [address] [place], [place], [address] Phone: [phone]   Fax:  [phone]  Occupational Therapy Treatment & Discharge  Patient Details  Name: Eric Brewer MRN: [id number] Date of Birth: 1941-08-25 Referring Provider (OT): Dr. Talbert Cage   Encounter Date: 10/15/2020   OT End of Session - 10/15/20 1232    Visit Number 9    Number of Visits 9    Date for OT Re-Evaluation 10/01/20    Authorization Type UHC Medicare    Authorization Time Period 10th visit progress note, $35 copay    Authorization - Visit Number 9    Authorization - Number of Visits 10    Progress Note Due on Visit 10    OT Start Time 1232    OT Stop Time 1302    OT Time Calculation (min) 30 min    Activity Tolerance Patient tolerated treatment well    Behavior During Therapy [facility] for tasks assessed/performed           Past Medical History:  Diagnosis Date  . Arthritis   . COPD (chronic obstructive pulmonary disease) (HCC)    MINIMAL   . History of kidney stones     Past Surgical History:  Procedure Laterality Date  . CATARACT EXTRACTION W/ INTRAOCULAR LENS IMPLANT     RIGHT  . 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 TONSILLECTOMY    . TOTAL SHOULDER ARTHROPLASTY Right 10/25/2014   Procedure: TOTAL SHOULDER ARTHROPLASTY;  Surgeon: Nita Sells, MD;  Location: [facility];  Service: Orthopedics;  Laterality: Right;  Right total shoulder arthroplasty    There were no vitals filed for this visit.   Subjective Assessment - 10/15/20 1232    Subjective  Pt denies any changes since last session and denies any pain.    Pertinent History PMH COPD and HTN    Patient Stated Goals "I just want to be able to drive a car"    Currently in Pain? No/denies               OCCUPATIONAL THERAPY DISCHARGE SUMMARY  Visits from Start of Care: 9  Current functional level related to goals / functional outcomes: Pt has  met all goals from initial plan of care. Pt has made improvements and patient and patient's spouse report patient is completing most of prior level activities independently. (cooking and cleaning, yardwork). Pt wishes to return to driving and has been issued driving evaluation recommendation. See clinical impression.   Remaining deficits: Continue to have deficits with return to driving.   Education / Equipment: HEPs  Plan: Patient agrees to discharge.  Patient goals were met. Patient is being discharged due to meeting the stated rehab goals.  ?????               OT Treatments/Exercises (OP) - 10/15/20 1245      Cognitive Exercises   Problem Solving Logic Links beginner levels. Increased difficulty with more complex puzzles and clues and required increased time Overall, improvement noted with problem solving and executive functioning/deductive reasoning skills.                  OT Education - 10/15/20 1244    Education Details Reviewed education on return to driving and drivers evaluation    Person(s) Educated Patient    Methods Explanation    Comprehension Verbalized understanding;Need further instruction            OT  Short Term Goals - 10/15/20 1232      OT SHORT TERM GOAL #1   Title Pt will be independent with HEP 09/03/20    Time 4    Period Weeks    Status Achieved    Target Date 09/03/20      OT SHORT TERM GOAL #2   Title Pt will complete tabletop scanning with 90% accuracy for increase in use of visual strategies    Time 4    Period Weeks    Status Achieved      OT SHORT TERM GOAL #3   Title Pt will complete environmental scanning with 75% accuracy.    Time 4    Period Weeks    Status Achieved      OT SHORT TERM GOAL #4   Status Achieved      OT SHORT TERM GOAL #5   Title Pt will verbalize understanding of visual strategies for left peripheral visual field deficit    Status Achieved             OT Long Term Goals - 10/15/20 1233       OT LONG TERM GOAL #1   Title Pt and spouse will verbalize understanding of return to driving recommendations.    Time 8    Period Weeks    Status Achieved      OT LONG TERM GOAL #2   Title Pt will return to prior level home management and yardwork with good safety awareness and mod I.    Time 8    Period Weeks    Status Achieved   pt reports this is not a problem     OT LONG TERM GOAL #3   Title Pt will perform environmental scanning with 90% accuracy with physical component in order to address return to driving skills.    Time 8    Period Weeks    Status Achieved      OT LONG TERM GOAL #4   Title Pt will plan and execute or follow a recipe and perform mod complex cooking task with mod I for return to prior level cooking    Time 8    Period Weeks    Status Achieved   pt reports this is not a problem and wife verifies                Plan - 10/15/20 1239    Clinical Impression Statement Pt has met all goals at this time and is ready to discharge. Pt continues to present with poor awareness into deficits. Pt has improved with tabletop and environmental scanning and overall improvement with functional cognition however demonstrates continued deficits with memory, awareness and problem solving. Pt continues to have left peripheral visual field deficits. Pt reports (with spouse verifying) return to mowing the yard, cooking and IADLs. Pt expressed wishes for return to driving. Education provided on driver evaluation for return to driving and readiness for driving.    OT Occupational Profile and History Problem Focused Assessment - Including review of records relating to presenting problem    Occupational performance deficits (Please refer to evaluation for details): ADL's;IADL's;Leisure    Body Structure / Function / Physical Skills ADL;Decreased knowledge of use of DME;GMC;Tone;UE functional use;Dexterity;Balance;IADL;ROM;Coordination;FMC;Decreased knowledge of  precautions;Flexibility    Rehab Potential Good    Clinical Decision Making Limited treatment options, no task modification necessary    Comorbidities Affecting Occupational Performance: None    Modification or Assistance to Complete Evaluation  No  modification of tasks or assist necessary to complete eval    OT Frequency 1x / week    OT Duration 8 weeks    OT Treatment/Interventions Self-care/ADL training;DME and/or AE instruction;Balance training;Therapeutic activities;Therapeutic exercise;Cognitive remediation/compensation;Neuromuscular education;Functional Mobility Training;Visual/perceptual remediation/compensation;Patient/family education;Energy conservation    Plan discharge    OT Home Exercise Plan visual scanning    Consulted and Agree with Plan of Care Patient           Patient will benefit from skilled therapeutic intervention in order to improve the following deficits and impairments:   Body Structure / Function / Physical Skills: ADL,Decreased knowledge of use of DME,GMC,Tone,UE functional use,Dexterity,Balance,IADL,ROM,Coordination,FMC,Decreased knowledge of precautions,Flexibility       Visit Diagnosis: Visuospatial deficit  Other lack of coordination  Attention and concentration deficit  Frontal lobe and executive function deficit  Other abnormalities of gait and mobility  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 Cryptogenic stroke ([place] [place]) 06/24/2020  . CVA (cerebral vascular accident) ([place]) 06/16/2020  . 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

## 2020-10-22 NOTE — Progress Notes (Signed)
[facility] Summary Report / Loop Recorder 

## 2020-10-28 ENCOUNTER — Ambulatory Visit: Payer: Medicare Other | Admitting: Adult Health

## 2020-11-06 LAB — CUP PACEART REMOTE DEVICE CHECK
Date Time Interrogation Session: 20220201130608
Implantable Pulse Generator Implant Date: 20210920

## 2020-11-11 ENCOUNTER — Ambulatory Visit (INDEPENDENT_AMBULATORY_CARE_PROVIDER_SITE_OTHER): Payer: Medicare Other

## 2020-11-11 DIAGNOSIS — I639 Cerebral infarction, unspecified: Secondary | ICD-10-CM

## 2020-11-18 NOTE — Progress Notes (Signed)
[facility] Summary Report / Loop Recorder 

## 2020-11-20 ENCOUNTER — Other Ambulatory Visit: Payer: Self-pay

## 2020-11-20 ENCOUNTER — Ambulatory Visit: Payer: Medicare Other | Admitting: Adult Health

## 2020-11-20 ENCOUNTER — Encounter: Payer: Self-pay | Admitting: Adult Health

## 2020-11-20 VITALS — BP 133/83 | HR 69 | Ht 70.0 in | Wt 206.0 lb

## 2020-11-20 DIAGNOSIS — I639 Cerebral infarction, unspecified: Secondary | ICD-10-CM | POA: Diagnosis not present

## 2020-11-20 NOTE — Patient Instructions (Signed)
Follow up with ophthalmology next week for further evaluation and any concerns of returning to driving from a visual standpoint.  If cleared from their standpoint, would recommend pursuing further driving evaluation prior to complete return  Continue aspirin 81 mg daily  and atorvastatin 80 mg daily for secondary stroke prevention  Continue to follow up with PCP regarding cholesterol and blood pressure management  Maintain strict control of hypertension with blood pressure goal below 130/90  and cholesterol with LDL cholesterol (bad cholesterol) goal below 70 mg/dL.       Followup in the future with me in 6 months or call earlier if needed       Thank you for coming to see [place] at [facility]. I hope we have been able to provide you high quality care today.  You may receive a patient satisfaction survey over the next few weeks. We would appreciate your feedback and comments so that we may continue to improve ourselves and the health of our patients.     Driving After a Stroke Driving can be dangerous after a stroke because a stroke can cause physical, emotional, cognitive, and behavioral changes. Damage to your brain and other parts of your nervous system may affect your ability to drive. You may have weakness, stiffness, and pain, and have problems moving, talking, seeing, touching, or problem-solving. A stroke can also cause inability to move (paralysis) on one side of your body. Can I return to driving? Ask your health care provider when it is safe for you to drive. Laws on driving after a stroke vary by state. Your health care provider may recommend that you:  Get a driving evaluation to have your vision, thinking, reaction time, and driving skills tested.  Take a driving rehabilitation program for people who have had a stroke.  Take a driving class or a retraining program.   How is driving affected by a stroke? A family member may be the first to notice that  it is not safe for you to drive. You may have problems with:  Your vision.  Talking and communicating.  Weakness, pain, and stiffness in your arms or legs.  Responding to changes on the road.  Using the steering wheel, pedals, and other parts of the car.  Thinking while driving.  Judgment on the road. What are some signs that it may not be safe for me to drive? Signs that driving may be unsafe for you include:  Driving too fast or too slowly.  Needing help from others while driving.  Not paying attention to street signs or signals.  Making bad decisions while driving.  Not keeping enough distance between cars.  Drifting into other lanes.  Becoming confused, angry, or frustrated.  Getting lost in familiar places.  Having accidents while driving. What is adaptive equipment? Adaptive equipment refers to devices that can help people who have had a stroke to drive and do other activities. You may need:  A wheelchair-accessible car.  Special hand controls in the car.  Pedal extensions for the car.  A seat base to help you stay positioned in your seat.  Lifts and ramps to help you get in and out of the car. Summary  Damage to your brain and other parts of your nervous system may affect your ability to drive.  Ask your health care provider when it is safe for you to drive again. You may need to take steps such as getting a driving evaluation or taking a driving class.  A family member may be the first to notice that it is not safe for you to drive.  You may need adaptive equipment to drive safely. This information is not intended to replace advice given to you by your health care provider. Make sure you discuss any questions you have with your health care provider. Document Revised: 09/03/2017 Document Reviewed: 12/28/2016 Elsevier Patient Education  2021 [employer].

## 2020-11-20 NOTE — Progress Notes (Signed)
[facility] [address] [place]. [place] [id number] [phone]       STROKE FOLLOW UP NOTE  Mr. Eric NiemannGeorge D Brewer Date of Birth:  October 22, 1940 Medical Record Number:  [id number]   Reason for Referral: stroke follow up    SUBJECTIVE:   CHIEF COMPLAINT:  Chief Complaint  Patient presents with  . Follow-up    RM 14 with wife Eric Brewer(Eric Brewer) Pt is well, no symptoms     HPI:   Today, 11/20/2020, Eric Brewer returns for stroke follow-up after prior visit 4 months ago accompanied by his wife, Eric BlonderDenise.  Reports residual bilateral left peripheral impairment and cognitive impairment (memory, awareness with poor insight into deficits and problem solving).  He does report gradual improvement since prior visit and has since completed therapies.  He is requesting return back to driving. Denies new stroke/TIA symptoms  Compliance with aspirin 81 mg daily and atorvastatin 80 mg daily Blood pressure today 133/83 Loop recorder has not shown atrial fibrillation thus far Recent A1c 6.8 Recent LDL 82 (down from 113)    History provided for reference purposes only Initial visit 07/23/2020 JM: Eric Brewer is being seen for hospital follow-up accompanied by his daughter.  Reports residual imbalance, left periphery impairment and cognitive impairment.  Has been working with PT with improvement of imbalance.  He does report decreased vision OS at baseline from corneal transplant but worse post stroke. Has appt with ophthalmology Thursday.  He requests return to driving.  Short-term memory concerns prior to his stroke with worsening post stroke. Long-term memory intact.  Denies new or worsening stroke/TIA symptoms.  He has continued on DAPT despite 3-week recommendation but denies bleeding or bruising.  Continues on atorvastatin 80 mg daily without myalgias.  Blood pressure today 135/73.  Monitors at home which has been stable. Evaluated by Dr. Ladona Ridgelaylor on 06/24/2020 with loop recorder placed which  has not shown atrial fibrillation thus far.  He does endorse symptoms of snoring, daytime fatigue despite going to bed around 7 PM and awakening at 6 AM, insomnia, and nocturia.  Denies any witnessed apnea.  No prior sleep study to evaluate for sleep apnea.  No further concerns at this time.  Stroke admission 06/16/2020 Eric Brewer is a 80 y.o. 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  Personally reviewed recent hospitalization pertinent progress notes, lab work and imaging with summary provided.  Stroke work-up revealed R PCA territory infarct with mild petechial hemorrhage, embolic pattern secondary to unknown source.  Recommended 30-day cardiac event monitor vs loop recorder outpatient to assess for A. fib as possible stroke etiology.  Recommended DAPT for 3 weeks and aspirin alone.  HTN stable with long-term BP goal normotensive range.  LDL 113 and switch simvastatin to atorvastatin 80 mg daily.  Other stroke risk factors include advanced age, former tobacco use, EtOH use and obesity.  Other active problems include baseline short-term memory deficits and COPD.  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  Evaluated by therapy and recommended outpatient PT/OT  Stroke:   R PCA territory  infarct - embolic - secondary to unknown source  CT head subacute R temporal lobe into R occipital infarct. R PCA thrombus extending into infarct. Small vessel disease. Atrophy.   MRI  Large R PCA territory infarct w/ mild petechial hemorrhage. Small vessel disease. Atrophy. Possible R basal ganglia chronic  lacune vs vascular space. Probable tiny B cerebellar infarcts.  CTA head hypoplastic R P1 w/ large R PCom. R PCA occlusion from proximal P2.  CTA neck mild to moderate plaque B ICA bifurcations and proximal   2D Echo EF  55-60%. No source of embolus   LDL 113  HgbA1c 6.3  VTE prophylaxis - SCDs   states he does not take aspirin prior to admission prior to admission, now on No antithrombotic. 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 Discussed w/ FP team      ROS:   14 system review of systems performed and negative with exception of those listed in HPI  PMH:  Past Medical History:  Diagnosis Date  . Arthritis   . COPD (chronic obstructive pulmonary disease) (HCC)    MINIMAL   . History of kidney stones     PSH:  Past Surgical History:  Procedure Laterality Date  . CATARACT EXTRACTION W/ INTRAOCULAR LENS IMPLANT     RIGHT  . 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 TONSILLECTOMY    . TOTAL SHOULDER ARTHROPLASTY Right 10/25/2014   Procedure: TOTAL SHOULDER ARTHROPLASTY;  Surgeon: Mable Paris, MD;  Location: [facility] OR;  Service: Orthopedics;  Laterality: Right;  Right total shoulder arthroplasty    Social History:  Social History   Socioeconomic History  . Marital status: Married    Spouse name: Eric Brewer  . Number of children: Not on file  . Years of education: Not on file  . Highest education level: Bachelor's degree (e.g., BA, AB, BS)  Occupational History  . Not on file  Tobacco Use  . Smoking status: Former Games developer  . Smokeless tobacco: Never Used  . Tobacco comment: quit 10-05-06  Substance and Sexual Activity  . Alcohol use: Yes    Alcohol/week: 3.0 standard drinks    Types: 2 Glasses of wine, 1 Cans of beer per week    Comment: GLASS WINE OR DRINK DAILY  . Drug use: No  . Sexual activity: Not on file  Other Topics Concern  . Not on file  Social History Narrative   Lives with wife   Caffeine- 4-5 c daily   Social Determinants of Health    Financial Resource Strain: Not on file  Food Insecurity: Not on file  Transportation Needs: Not on file  Physical Activity: Not on file  Stress: Not on file  Social Connections: Not on file  Intimate Partner Violence: Not on file    Family History:  Family History  Problem Relation Age of Onset  . Cancer Father   . Stroke Father   . Parkinson's disease Brother     Medications:   Current Outpatient Medications on File Prior to Visit  Medication Sig Dispense Refill  . aspirin EC 81 MG tablet Take 1 tablet (81 mg total) by mouth daily. Swallow whole. 30 tablet 0  . atorvastatin (LIPITOR) 80 MG tablet Take 1 tablet (80 mg total) by mouth daily. 90 tablet 0  . diphenhydrAMINE (BENADRYL) 25 MG tablet Take 50 mg by mouth at bedtime as needed for sleep.    . montelukast (SINGULAIR) 10 MG tablet Take 1 tablet by mouth at bedtime.  3  .  Multiple Vitamins-Minerals (MULTIVITAMIN WITH MINERALS) tablet Take 1 tablet by mouth daily.    Marland Kitchen olmesartan (BENICAR) 40 MG tablet Take 40 mg by mouth daily.    . Omega-3 Fatty Acids (FISH OIL PO) Take 1 capsule by mouth daily.    . predniSONE (DELTASONE) 5 MG tablet Take 5 mg by mouth daily with breakfast.    . vitamin B-12 (CYANOCOBALAMIN) 1000 MCG tablet Take 1,000 mcg by mouth daily.     No current facility-administered medications on file prior to visit.    Allergies:  No Known Allergies    OBJECTIVE:  Physical Exam  Vitals:   11/20/20 1008  BP: 133/83  Pulse: 69  Weight: 206 lb (93.4 kg)  Height: 5\' 10"  (1.778 m)   Body mass index is 29.56 kg/m. No exam data present  General: well developed, well nourished,  elderly Caucasian male, seated, in no evident distress Head: head normocephalic and atraumatic.   Neck: supple with no carotid or supraclavicular bruits Cardiovascular: regular rate and rhythm, no murmurs Musculoskeletal: no deformity Skin:  no rash/petichiae Vascular:  Normal pulses all extremities   Neurologic  Exam Mental Status: Awake and fully alert.   Fluent speech and language.  Oriented to place and time. Recent memory impaired and remote memory intact. Attention span, concentration and fund of knowledge mostly intact during visit. Mood and affect appropriate.  Recall 2/3. 4 legged animal naming 11 in 60 sec. able to do serial addition. Cranial Nerves: Pupils equal, briskly reactive to light. Extraocular movements full without nystagmus. Visual fields show left homonymous hemianopia but difficulty fully assessing extent as pt had difficulty following directions. Hearing intact. Facial sensation intact. Face, tongue, palate moves normally and symmetrically.  Motor: Normal bulk and tone. Normal strength in all tested extremity muscles. Sensory.: intact to touch , pinprick , position and vibratory sensation.  Coordination: Rapid alternating movements normal in all extremities. Finger-to-nose and heel-to-shin performed accurately bilaterally. Gait and Station: Arises from chair without difficulty. Stance is normal. Gait demonstrates normal stride length and balance without use of assistive device.    Able to heel toe without difficulty. Reflexes: 1+ and symmetric. Toes downgoing.        ASSESSMENT: Eric Brewer is a 80 y.o. year old male presented with confusion, change in vision and vertigo on 06/16/2020 with stroke work-up revealing R PCA territory infarct, embolic secondary to unknown source.  Loop recorder placed on 06/24/2020 by Dr. 06/26/2020.  Vascular risk factors include HTN, HLD, former tobacco use and EtOH use.      PLAN:  1. R PCA stroke, cryptogenic:  a. Residual deficit: Cognitive impairment and left homonymous hemianopia i. Evaluation next week with ophthalmology -advised if he is cleared for driving from a visual standpoint, it would be highly recommended to proceed with formal driving eval due to cognitive concerns (see OT and SLP notes) - wife agrees with this recommendation -patient  verbalized understanding b. Continue aspirin 81 mg daily  and atorvastatin 80 mg daily for secondary stroke prevention.  c.  Loop recorder has not shown atrial fibrillation thus far - will continue to be monitored by cardiology.   d. Discussed secondary stroke prevention measures and importance of close PCP follow up for aggressive stroke risk factor management  2. HTN: BP goal <130/90.  Stable today.  Managed by PCP. 3. HLD: LDL goal <70. Recent LDL 83 down from 112.  Continue atorvastatin 80 mg daily per PCP 4. Suspected sleep apnea: Evaluated by Dr. Ladona Ridgel 08/2020 -  although highly encouraged with extensive education provided, he declined pursuing sleep study at that time.  He continues to decline sleep study and wishes to wait until further testing is completed as return to driving his of his greatest concern.  He was advised to call office when he is ready to proceed    Follow up in 6 months or call earlier if needed   CC:  [facility] provider: Dr. Mechele Collin, Letha Cape, NP    I spent 30 minutes of face-to-face and non-face-to-face time with patient and wife.  This included previsit chart review, lab review, study review, order entry, electronic health record documentation, patient education regarding recent stroke including etiology, residual deficits and concern of return to driving, importance of managing stroke risk factors and answered all other questions to patient and wife's satisfaction   Ihor Austin, AGNP-BC  [facility] [address] [place], [place] [address]  Phone [phone] Fax [phone] Note: This document was prepared with digital dictation and possible smart phrase technology. Any transcriptional errors that result from this process are unintentional.

## 2020-11-21 NOTE — Progress Notes (Signed)
I agree with the above plan 

## 2020-12-16 ENCOUNTER — Ambulatory Visit (INDEPENDENT_AMBULATORY_CARE_PROVIDER_SITE_OTHER): Payer: Medicare Other

## 2020-12-16 DIAGNOSIS — I639 Cerebral infarction, unspecified: Secondary | ICD-10-CM | POA: Diagnosis not present

## 2020-12-16 LAB — CUP PACEART REMOTE DEVICE CHECK
Date Time Interrogation Session: 20220306130856
Implantable Pulse Generator Implant Date: 20210920

## 2020-12-23 NOTE — Progress Notes (Signed)
[facility] Summary Report / Loop Recorder 

## 2021-01-20 ENCOUNTER — Ambulatory Visit (INDEPENDENT_AMBULATORY_CARE_PROVIDER_SITE_OTHER): Payer: Medicare Other

## 2021-01-20 DIAGNOSIS — I639 Cerebral infarction, unspecified: Secondary | ICD-10-CM

## 2021-01-21 LAB — CUP PACEART REMOTE DEVICE CHECK
Date Time Interrogation Session: 20220408130641
Implantable Pulse Generator Implant Date: 20210920

## 2021-02-05 NOTE — Progress Notes (Signed)
[facility] Summary Report / Loop Recorder 

## 2021-02-12 ENCOUNTER — Ambulatory Visit (INDEPENDENT_AMBULATORY_CARE_PROVIDER_SITE_OTHER): Payer: Medicare Other

## 2021-02-12 DIAGNOSIS — Z95818 Presence of other cardiac implants and grafts: Secondary | ICD-10-CM

## 2021-02-12 DIAGNOSIS — I639 Cerebral infarction, unspecified: Secondary | ICD-10-CM

## 2021-02-13 LAB — CUP PACEART REMOTE DEVICE CHECK
Date Time Interrogation Session: 20220511130428
Implantable Pulse Generator Implant Date: 20210920

## 2021-03-06 NOTE — Progress Notes (Signed)
[facility] Summary Report / Loop Recorder 

## 2021-03-17 ENCOUNTER — Ambulatory Visit (INDEPENDENT_AMBULATORY_CARE_PROVIDER_SITE_OTHER): Payer: Medicare Other

## 2021-03-17 DIAGNOSIS — I639 Cerebral infarction, unspecified: Secondary | ICD-10-CM

## 2021-03-18 LAB — CUP PACEART REMOTE DEVICE CHECK
Date Time Interrogation Session: 20220613130503
Implantable Pulse Generator Implant Date: 20210920

## 2021-04-08 NOTE — Progress Notes (Signed)
[facility] Summary Report / Loop Recorder 

## 2021-04-21 ENCOUNTER — Ambulatory Visit (INDEPENDENT_AMBULATORY_CARE_PROVIDER_SITE_OTHER): Payer: Medicare Other

## 2021-04-21 DIAGNOSIS — I639 Cerebral infarction, unspecified: Secondary | ICD-10-CM | POA: Diagnosis not present

## 2021-04-22 LAB — CUP PACEART REMOTE DEVICE CHECK
Date Time Interrogation Session: 20220716130903
Implantable Pulse Generator Implant Date: 20210920

## 2021-05-13 NOTE — Progress Notes (Signed)
[facility] Summary Report / Loop Recorder 

## 2021-05-26 ENCOUNTER — Encounter: Payer: Self-pay | Admitting: Adult Health

## 2021-05-26 ENCOUNTER — Ambulatory Visit (INDEPENDENT_AMBULATORY_CARE_PROVIDER_SITE_OTHER): Payer: Medicare Other

## 2021-05-26 ENCOUNTER — Ambulatory Visit: Payer: Medicare Other | Admitting: Adult Health

## 2021-05-26 VITALS — BP 128/84 | HR 67 | Ht 70.0 in | Wt 203.4 lb

## 2021-05-26 DIAGNOSIS — I639 Cerebral infarction, unspecified: Secondary | ICD-10-CM | POA: Diagnosis not present

## 2021-05-26 DIAGNOSIS — I10 Essential (primary) hypertension: Secondary | ICD-10-CM | POA: Diagnosis not present

## 2021-05-26 DIAGNOSIS — H53462 Homonymous bilateral field defects, left side: Secondary | ICD-10-CM

## 2021-05-26 DIAGNOSIS — E785 Hyperlipidemia, unspecified: Secondary | ICD-10-CM

## 2021-05-26 NOTE — Progress Notes (Signed)
[facility] [address] [place]. [place] [address] [phone]       STROKE FOLLOW UP NOTE  [name] Date of Birth:  26-Jan-1941 Medical Record Number:  [id number]   Reason for Referral: stroke follow up    SUBJECTIVE:   CHIEF COMPLAINT:  Chief Complaint  Patient presents with   Follow-up    Rm 2 with wife- Here for 6 month f/u. Pt reports he has been doing ok since his last visit.       HPI:   Today, 05/26/2021, [name] returns for 23-month stroke follow-up accompanied by his wife.  Overall doing well.  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  He believes vision has improved.  Cognitive impairment has been improving per both pt and wife.  Still has some difficulty with altered timeline (reports cornea surgery 5 yrs ago but was actually >20 yrs ago), remembering names and mild short-term memory impairment.  Denies new stroke/TIA symptoms.  Compliant on aspirin and atorvastatin without side effects.  Blood pressure today 128/84.  Loop recorder has not shown atrial fibrillation thus far.  No further concerns at this time.    History provided for reference purposes only Update 11/20/2020 JM: [name] returns for stroke follow-up after prior visit 4 months ago accompanied by his wife, [name].  Reports residual bilateral left peripheral impairment and cognitive impairment (memory, awareness with poor insight into deficits and problem solving).  He does report gradual improvement since prior visit and has since completed therapies.  He is requesting return back to driving. Denies new stroke/TIA symptoms  Compliance with aspirin 81 mg daily and atorvastatin 80 mg daily Blood pressure today 133/83 Loop recorder has not shown atrial fibrillation thus far Recent A1c 6.8 Recent LDL 82 (down from 113)  Initial visit  07/23/2020 JM: [name] is being seen for hospital follow-up accompanied by his daughter.  Reports residual imbalance, left periphery impairment and cognitive impairment.  Has been working with PT with improvement of imbalance.  He does report decreased vision OS at baseline from corneal transplant but worse post stroke. Has appt with ophthalmology Thursday.  He requests return to driving.  Short-term memory concerns prior to his stroke with worsening post stroke. Long-term memory intact.  Denies new or worsening stroke/TIA symptoms.  He has continued on DAPT despite 3-week recommendation but denies bleeding or bruising.  Continues on atorvastatin 80 mg daily without myalgias.  Blood pressure today 135/73.  Monitors at home which has been stable. Evaluated by Dr. Ladona Ridgel on 06/24/2020 with loop recorder placed which has not shown atrial fibrillation thus far.  He does endorse symptoms of snoring, daytime fatigue despite going to bed around 7 PM and awakening at 6 AM, insomnia, and nocturia.  Denies any witnessed apnea.  No prior sleep study to evaluate for sleep apnea.  No further concerns at this time.  Stroke admission 06/16/2020 [name] is a 80 y.o. 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  Personally reviewed recent hospitalization pertinent progress notes, lab work and imaging with summary provided.  Stroke work-up revealed R PCA territory infarct with mild petechial hemorrhage, embolic pattern secondary to unknown source.  Recommended 30-day cardiac event monitor vs loop recorder outpatient to assess for A. fib as  possible stroke etiology.  Recommended DAPT for 3 weeks and aspirin alone.  HTN stable with long-term BP goal normotensive range.  LDL 113 and switch simvastatin to atorvastatin 80 mg daily.  Other stroke risk factors include advanced age, former tobacco use,  EtOH use and obesity.  Other active problems include baseline short-term memory deficits and COPD.  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  Evaluated by therapy and recommended outpatient PT/OT   Stroke:   R PCA territory  infarct - embolic - secondary to unknown source CT head subacute R temporal lobe into R occipital infarct. R PCA thrombus extending into infarct. Small vessel disease. Atrophy.  MRI  Large R PCA territory infarct w/ mild petechial hemorrhage. Small vessel disease. Atrophy. Possible R basal ganglia chronic lacune vs vascular space. Probable tiny B cerebellar infarcts. CTA head hypoplastic R P1 w/ large R PCom. R PCA occlusion from proximal P2. CTA neck mild to moderate plaque B ICA bifurcations and proximal  2D Echo EF 55-60%. No source of embolus  LDL 113 HgbA1c 6.3 VTE prophylaxis - SCDs  states he does not take aspirin prior to admission prior to admission, now on No antithrombotic. 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 Discussed w/ FP team      ROS:   14 system review of systems performed and negative with exception of those listed in HPI  PMH:  Past Medical History:  Diagnosis Date   Arthritis    COPD (chronic obstructive pulmonary disease) (HCC)    MINIMAL    History of kidney stones     PSH:  Past Surgical History:  Procedure Laterality Date   CATARACT EXTRACTION W/ INTRAOCULAR LENS IMPLANT     RIGHT   CORNEAL TRANSPLANT     1996 LEFT EYE   TONSILLECTOMY     TOTAL SHOULDER ARTHROPLASTY Right 10/25/2014   Procedure: TOTAL SHOULDER ARTHROPLASTY;  Surgeon: Mable Paris, MD;  Location: [facility] OR;  Service: Orthopedics;  Laterality: Right;  Right total shoulder arthroplasty     Social History:  Social History   Socioeconomic History   Marital status: Married    Spouse name: [name]   Number of children: Not on file   Years of education: Not on file   Highest education level: Bachelor's degree (e.g., BA, AB, BS)  Occupational History   Not on file  Tobacco Use   Smoking status: Former   Smokeless tobacco: Never   Tobacco comments:    quit 10-05-06  Substance and Sexual Activity   Alcohol use: Yes    Alcohol/week: 3.0 standard drinks    Types: 2 Glasses of wine, 1 Cans of beer per week    Comment: GLASS WINE OR DRINK DAILY   Drug use: No   Sexual activity: Not on file  Other Topics Concern   Not on file  Social History Narrative   Lives with wife   Caffeine- 4-5 c daily   Social Determinants of Health   Financial Resource Strain: Not on file  Food Insecurity: Not on file  Transportation Needs: Not on file  Physical Activity: Not on file  Stress: Not on file  Social Connections: Not on file  Intimate Partner Violence: Not on  file    Family History:  Family History  Problem Relation Age of Onset   Cancer Father    Stroke Father    Parkinson's disease Brother     Medications:   Current Outpatient Medications on File Prior to Visit  Medication Sig Dispense Refill   aspirin EC 81 MG tablet Take 1 tablet (81 mg total) by mouth daily. Swallow whole. 30 tablet 0   atorvastatin (LIPITOR) 80 MG tablet Take 1 tablet (80 mg total) by mouth daily. 90 tablet 0   diphenhydrAMINE (BENADRYL) 25 MG tablet Take 50 mg by mouth at bedtime as needed for sleep.     montelukast (SINGULAIR) 10 MG tablet Take 1 tablet by mouth at bedtime.  3   Multiple Vitamins-Minerals (MULTIVITAMIN WITH MINERALS) tablet Take 1 tablet by mouth daily.     olmesartan (BENICAR) 40 MG tablet Take 40 mg by mouth daily.     Omega-3 Fatty Acids (FISH OIL PO) Take 1 capsule by mouth daily.     predniSONE (DELTASONE) 5 MG tablet Take 5 mg by mouth daily with breakfast.     vitamin  B-12 (CYANOCOBALAMIN) 1000 MCG tablet Take 1,000 mcg by mouth daily.     No current facility-administered medications on file prior to visit.    Allergies:  No Known Allergies    OBJECTIVE:  Physical Exam  Vitals:   05/26/21 1020  BP: 128/84  Pulse: 67  SpO2: 94%  Weight: 203 lb 6 oz (92.3 kg)  Height: 5\' 10"  (1.778 m)    Body mass index is 29.18 kg/m. No results found.  General: well developed, well nourished, very pleasant elderly Caucasian male, seated, in no evident distress Head: head normocephalic and atraumatic.   Neck: supple with no carotid or supraclavicular bruits Cardiovascular: regular rate and rhythm, no murmurs Musculoskeletal: no deformity Skin:  no rash/petichiae Vascular:  Normal pulses all extremities   Neurologic Exam Mental Status: Awake and fully alert.   Fluent speech and language.  Oriented to place and time. Recent memory impaired and remote memory intact. Attention span, concentration and fund of knowledge mostly intact during visit. Mood and affect appropriate.  Recall 2/3. 4 legged animal naming 12 in 60 sec. able to do serial addition. Cranial Nerves: Pupils equal, briskly reactive to light. Extraocular movements full without nystagmus. Visual fields show left homonymous hemianopia (lower greater than upper). Hearing intact. Facial sensation intact. Face, tongue, palate moves normally and symmetrically.  Motor: Normal bulk and tone. Normal strength in all tested extremity muscles. Sensory.: intact to touch , pinprick , position and vibratory sensation.  Coordination: Rapid alternating movements normal in all extremities. Finger-to-nose and heel-to-shin performed accurately bilaterally. Gait and Station: Arises from chair without difficulty. Stance is normal. Gait demonstrates normal stride length and balance without use of assistive device.    Able to heel toe without difficulty. Reflexes: 1+ and symmetric. Toes downgoing.         ASSESSMENT: Sabino NiemannGeorge D Prisco is a 80 y.o. year old male presented with confusion, change in vision and vertigo on 06/16/2020 with stroke work-up revealing R PCA territory infarct, embolic secondary to unknown source.  Loop recorder placed on 06/24/2020 by Dr. Ladona Ridgelaylor.  Vascular risk factors include HTN, HLD, former tobacco use and EtOH use.      PLAN:  R PCA stroke, cryptogenic:  Residual deficit: Mild cognitive impairment and left homonymous hemianopia Re-eval by ophthalmology this week for reevaluation and possible return back to driving. Improvement of cognition compared to  prior visit - may not need extensive driving eval which was previously discussed but will wait to see what ophthalmology says Continue aspirin 81 mg daily and atorvastatin 80 mg daily for secondary stroke prevention.   Loop recorder has not shown atrial fibrillation thus far - will continue to be monitored by cardiology.   Discussed secondary stroke prevention measures and importance of close PCP follow up for aggressive stroke risk factor management  HTN: BP goal <130/90.  Stable today.  Managed by PCP. HLD: LDL goal <70. Prior LDL 83 (10/2020) down from 161.  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  Recent A1c 6.8.  Monitored by PCP Suspected sleep apnea: Continues to decline further evaluation at this time.  Again reiterated importance of evaluation for further secondary stroke prevention measures. He will call office if interested in pursuing in the future    Follow up in 6 months or call earlier if needed   CC:  [facility] provider: Dr. Mechele Collin, Letha Cape, NP    I spent 31 minutes of face-to-face and non-face-to-face time with patient and wife.  This included previsit chart review, lab review, study review, electronic health record documentation, patient and wife education regarding stroke including etiology, residual deficits and return to driving, review of ILR, secondary stroke  prevention measures and importance of managing stroke risk factors and answered all other questions to patient and wife's satisfaction  Ihor Austin, AGNP-BC  [facility] [address] [place], [place] [address]  Phone [phone] Fax [phone] Note: This document was prepared with digital dictation and possible smart phrase technology. Any transcriptional errors that result from this process are unintentional.

## 2021-05-27 LAB — CUP PACEART REMOTE DEVICE CHECK
Date Time Interrogation Session: 20220818130901
Implantable Pulse Generator Implant Date: 20210920

## 2021-05-27 NOTE — Progress Notes (Signed)
I agree with the above plan 

## 2021-06-11 NOTE — Progress Notes (Signed)
[facility] Summary Report / Loop Recorder 

## 2021-06-25 LAB — CUP PACEART REMOTE DEVICE CHECK
Date Time Interrogation Session: 20220920130613
Implantable Pulse Generator Implant Date: 20210920

## 2021-06-30 ENCOUNTER — Ambulatory Visit (INDEPENDENT_AMBULATORY_CARE_PROVIDER_SITE_OTHER): Payer: Medicare Other

## 2021-06-30 DIAGNOSIS — I639 Cerebral infarction, unspecified: Secondary | ICD-10-CM

## 2021-07-07 NOTE — Progress Notes (Signed)
[facility] Summary Report / Loop Recorder 

## 2021-07-29 ENCOUNTER — Encounter: Payer: Self-pay | Admitting: Adult Health

## 2021-07-29 LAB — CUP PACEART REMOTE DEVICE CHECK
Date Time Interrogation Session: 20221023130615
Implantable Pulse Generator Implant Date: 20210920

## 2021-07-29 NOTE — Telephone Encounter (Signed)
He was seen 2 months ago and both pt and wife reported gradual improvement of memory although mild difficulty persisted. Has cognition gradually declined since that time or abruptly declined right after prior visit? As I do not have any availability coming up anytime soon, he may want to schedule f/u visit sooner with PCP to rule out any reversible causes not stroke related - they can also do formal memory evaluation.

## 2021-08-04 ENCOUNTER — Ambulatory Visit (INDEPENDENT_AMBULATORY_CARE_PROVIDER_SITE_OTHER): Payer: Medicare Other

## 2021-08-04 DIAGNOSIS — I639 Cerebral infarction, unspecified: Secondary | ICD-10-CM

## 2021-08-11 NOTE — Progress Notes (Signed)
[facility] Summary Report / Loop Recorder 

## 2021-09-01 LAB — CUP PACEART REMOTE DEVICE CHECK
Date Time Interrogation Session: 20221125130514
Implantable Pulse Generator Implant Date: 20210920

## 2021-09-08 ENCOUNTER — Ambulatory Visit (INDEPENDENT_AMBULATORY_CARE_PROVIDER_SITE_OTHER): Payer: Medicare Other

## 2021-09-08 DIAGNOSIS — I639 Cerebral infarction, unspecified: Secondary | ICD-10-CM | POA: Diagnosis not present

## 2021-09-17 NOTE — Progress Notes (Signed)
[facility] Summary Report / Loop Recorder 

## 2021-10-13 ENCOUNTER — Ambulatory Visit (INDEPENDENT_AMBULATORY_CARE_PROVIDER_SITE_OTHER): Payer: Medicare Other

## 2021-10-13 DIAGNOSIS — I639 Cerebral infarction, unspecified: Secondary | ICD-10-CM | POA: Diagnosis not present

## 2021-10-13 LAB — CUP PACEART REMOTE DEVICE CHECK
Date Time Interrogation Session: 20230108230905
Implantable Pulse Generator Implant Date: 20210920

## 2021-10-22 NOTE — Progress Notes (Signed)
[facility] Summary Report / Loop Recorder 

## 2021-11-14 ENCOUNTER — Ambulatory Visit (INDEPENDENT_AMBULATORY_CARE_PROVIDER_SITE_OTHER): Payer: Medicare Other

## 2021-11-14 DIAGNOSIS — I639 Cerebral infarction, unspecified: Secondary | ICD-10-CM

## 2021-11-15 LAB — CUP PACEART REMOTE DEVICE CHECK
Date Time Interrogation Session: 20230210230651
Implantable Pulse Generator Implant Date: 20210920

## 2021-11-19 NOTE — Progress Notes (Signed)
[facility] Summary Report / Loop Recorder 

## 2021-12-01 ENCOUNTER — Encounter: Payer: Self-pay | Admitting: Adult Health

## 2021-12-01 ENCOUNTER — Ambulatory Visit: Payer: Medicare Other | Admitting: Adult Health

## 2021-12-01 VITALS — BP 145/74 | HR 59 | Ht 70.0 in | Wt 202.0 lb

## 2021-12-01 DIAGNOSIS — H53462 Homonymous bilateral field defects, left side: Secondary | ICD-10-CM

## 2021-12-01 DIAGNOSIS — I69319 Unspecified symptoms and signs involving cognitive functions following cerebral infarction: Secondary | ICD-10-CM

## 2021-12-01 DIAGNOSIS — I639 Cerebral infarction, unspecified: Secondary | ICD-10-CM

## 2021-12-01 NOTE — Progress Notes (Signed)
[facility] [address] [place]. [place] [address] [phone]       STROKE FOLLOW UP NOTE  [name] Date of Birth:  10/31/1940 Medical Record Number:  [id number]   Reason for Referral: stroke follow up    SUBJECTIVE:   CHIEF COMPLAINT:  Chief Complaint  Patient presents with   Follow-up    Rm 3  alone  Pt is well and stable, no new concerns      HPI:   Update 12/01/2021 JM: 81 year old male with history of right PCA stroke in 06/2020.  Returns for follow-up visit unaccompanied (wife waiting in lobby).  Overall stable without new stroke/TIA symptoms.  Residual left hemianopia and cognitive impairment stable. He does not drive but able to maintain ADLs independently.  Compliant on aspirin and atorvastatin without side effects.  Blood pressure today 145/74. Routinely monitors at home which has been stable.  Loop recorder has not shown atrial fibrillation thus far.  No further concerns at this time.    History provided for reference purposes only Update 05/26/2021 JM: Mr. [name] returns for 30-month stroke follow-up accompanied by his wife.  Overall doing well.  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  He believes vision has improved.  Cognitive impairment has been improving per both pt and wife.  Still has some difficulty with altered timeline (reports cornea surgery 5 yrs ago but was actually >20 yrs ago), remembering names and mild short-term memory impairment.  Denies new stroke/TIA symptoms.  Compliant on aspirin and atorvastatin without side effects.  Blood pressure today 128/84.  Loop recorder has not shown atrial fibrillation thus far.  No further concerns at this time.  Update 11/20/2020 JM: Mr. [name] returns for stroke follow-up after prior visit 4 months ago accompanied by his wife, Langley Gauss.  Reports residual  bilateral left peripheral impairment and cognitive impairment (memory, awareness with poor insight into deficits and problem solving).  He does report gradual improvement since prior visit and has since completed therapies.  He is requesting return back to driving. Denies new stroke/TIA symptoms  Compliance with aspirin 81 mg daily and atorvastatin 80 mg daily Blood pressure today 133/83 Loop recorder has not shown atrial fibrillation thus far Recent A1c 6.8 Recent LDL 82 (down from 113)  Initial visit 07/23/2020 JM: Mr. [name] is being seen for hospital follow-up accompanied by his daughter.  Reports residual imbalance, left periphery impairment and cognitive impairment.  Has been working with PT with improvement of imbalance.  He does report decreased vision OS at baseline from corneal transplant but worse post stroke. Has appt with ophthalmology Thursday.  He requests return to driving.  Short-term memory concerns prior to his stroke with worsening post stroke. Long-term memory intact.  Denies new or worsening stroke/TIA symptoms.  He has continued on DAPT despite 3-week recommendation but denies bleeding or bruising.  Continues on atorvastatin 80 mg daily without myalgias.  Blood pressure today 135/73.  Monitors at home which has been stable. Evaluated by Dr. Lovena Le on 06/24/2020 with loop recorder placed which has not shown atrial fibrillation thus far.  He does endorse symptoms of snoring, daytime fatigue despite going to bed around 7 PM and awakening at 6 AM, insomnia, and nocturia.  Denies any witnessed apnea.  No prior sleep study to evaluate for sleep apnea.  No further concerns at this time.  Stroke admission 06/16/2020 Mr. [name]  D [name] is a 81 y.o. 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  Personally reviewed recent hospitalization pertinent progress notes, lab work  and imaging with summary provided.  Stroke work-up revealed R PCA territory infarct with mild petechial hemorrhage, embolic pattern secondary to unknown source.  Recommended 30-day cardiac event monitor vs loop recorder outpatient to assess for A. fib as possible stroke etiology.  Recommended DAPT for 3 weeks and aspirin alone.  HTN stable with long-term BP goal normotensive range.  LDL 113 and switch simvastatin to atorvastatin 80 mg daily.  Other stroke risk factors include advanced age, former tobacco use, EtOH use and obesity.  Other active problems include baseline short-term memory deficits and COPD.  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  Evaluated by therapy and recommended outpatient PT/OT   Stroke:   R PCA territory  infarct - embolic - secondary to unknown source CT head subacute R temporal lobe into R occipital infarct. R PCA thrombus extending into infarct. Small vessel disease. Atrophy.  MRI  Large R PCA territory infarct w/ mild petechial hemorrhage. Small vessel disease. Atrophy. Possible R basal ganglia chronic lacune vs vascular space. Probable tiny B cerebellar infarcts. CTA head hypoplastic R P1 w/ large R PCom. R PCA occlusion from proximal P2. CTA neck mild to moderate plaque B ICA bifurcations and proximal  2D Echo EF 55-60%. No source of embolus  LDL 113 HgbA1c 6.3 VTE prophylaxis - SCDs  states he does not take aspirin prior to admission prior to admission, now on No antithrombotic. 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 Discussed w/ FP team      ROS:   14 system review of systems performed and negative with exception of those listed in HPI  PMH:  Past Medical History:  Diagnosis Date    Arthritis    COPD (chronic obstructive pulmonary disease) (HCC)    MINIMAL    History of kidney stones     PSH:  Past Surgical History:  Procedure Laterality Date   CATARACT EXTRACTION W/ INTRAOCULAR LENS IMPLANT     RIGHT   CORNEAL TRANSPLANT     1996 LEFT EYE   TONSILLECTOMY     TOTAL SHOULDER ARTHROPLASTY Right 10/25/2014   Procedure: TOTAL SHOULDER ARTHROPLASTY;  Surgeon: Mable ParisJustin William Chandler, MD;  Location: [facility] OR;  Service: Orthopedics;  Laterality: Right;  Right total shoulder arthroplasty    Social History:  Social History   Socioeconomic History   Marital status: Married    Spouse name: [name]   Number of children: Not on file   Years of education: Not on file   Highest education level: Bachelor's degree (e.g., BA, AB, BS)  Occupational History   Not on file  Tobacco Use   Smoking status: Former   Smokeless tobacco: Never   Tobacco comments:    quit 10-05-06  Substance and Sexual Activity   Alcohol use: Yes    Alcohol/week: 3.0 standard drinks    Types: 2 Glasses of wine, 1 Cans of beer per week    Comment: GLASS WINE OR DRINK DAILY   Drug use:  No   Sexual activity: Not on file  Other Topics Concern   Not on file  Social History Narrative   Lives with wife   Caffeine- 4-5 c daily   Social Determinants of Health   Financial Resource Strain: Not on file  Food Insecurity: Not on file  Transportation Needs: Not on file  Physical Activity: Not on file  Stress: Not on file  Social Connections: Not on file  Intimate Partner Violence: Not on file    Family History:  Family History  Problem Relation Age of Onset   Cancer Father    Stroke Father    Parkinson's disease Brother     Medications:   Current Outpatient Medications on File Prior to Visit  Medication Sig Dispense Refill   aspirin EC 81 MG tablet Take 1 tablet (81 mg total) by mouth daily. Swallow whole. 30 tablet 0   atorvastatin (LIPITOR) 80 MG tablet Take 1 tablet (80 mg total) by mouth  daily. 90 tablet 0   diphenhydrAMINE (BENADRYL) 25 MG tablet Take 50 mg by mouth at bedtime as needed for sleep.     montelukast (SINGULAIR) 10 MG tablet Take 1 tablet by mouth at bedtime.  3   Multiple Vitamins-Minerals (MULTIVITAMIN WITH MINERALS) tablet Take 1 tablet by mouth daily.     olmesartan (BENICAR) 40 MG tablet Take 40 mg by mouth daily.     Omega-3 Fatty Acids (FISH OIL PO) Take 1 capsule by mouth daily.     predniSONE (DELTASONE) 5 MG tablet Take 5 mg by mouth daily with breakfast.     vitamin B-12 (CYANOCOBALAMIN) 1000 MCG tablet Take 1,000 mcg by mouth daily.     No current facility-administered medications on file prior to visit.    Allergies:  No Known Allergies    OBJECTIVE:  Physical Exam  Vitals:   12/01/21 1001  BP: (!) 145/74  Pulse: (!) 59  Weight: 202 lb (91.6 kg)  Height: 5\' 10"  (1.778 m)   Body mass index is 28.98 kg/m. No results found.  General: well developed, well nourished, very pleasant elderly Caucasian male, seated, in no evident distress Head: head normocephalic and atraumatic.   Neck: supple with no carotid or supraclavicular bruits Cardiovascular: regular rate and rhythm, no murmurs Musculoskeletal: no deformity Skin:  no rash/petichiae Vascular:  Normal pulses all extremities   Neurologic Exam Mental Status: Awake and fully alert.  Fluent speech and language. Oriented to place and time. Recent memory impaired and remote memory intact. Attention span, concentration and fund of knowledge appropriate during visit. Mood and affect appropriate.  Recall 2/3, with prompt 3/3. 4 legged animal naming 12 in 60 sec. able to do serial addition without difficulty Cranial Nerves: Pupils equal, briskly reactive to light. Extraocular movements full without nystagmus. Visual fields show left homonymous hemianopia (lower > upper). Hearing intact. Facial sensation intact. Face, tongue, palate moves normally and symmetrically.  Motor: Normal bulk and tone.  Normal strength in all tested extremity muscles. Sensory.: intact to touch , pinprick , position and vibratory sensation.  Coordination: Rapid alternating movements normal in all extremities. Finger-to-nose and heel-to-shin performed accurately bilaterally. Gait and Station: Arises from chair without difficulty. Stance is normal. Gait demonstrates normal stride length and balance without use of assistive device.    Able to heel toe without difficulty. Reflexes: 1+ and symmetric. Toes downgoing.        ASSESSMENT: FLEET RIGGAN is a 81 y.o. year old male presented with confusion, change in vision  and vertigo on 06/16/2020 with stroke work-up revealing R PCA territory infarct, embolic secondary to unknown source.  Loop recorder placed on 06/24/2020 by Dr. Lovena Le.  Vascular risk factors include HTN, HLD, former tobacco use and EtOH use.      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   Loop recorder has not shown atrial fibrillation thus far - will continue to be monitored by cardiology.   Discussed secondary stroke prevention measures and importance of close PCP follow up for aggressive stroke risk factor management  HTN: BP goal <130/90.  Stable today.  Managed by PCP. HLD: LDL goal <70. 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  A1c 7.1.  Monitored by PCP Suspected sleep apnea: Continues to decline further evaluation at this time.  Again reiterated importance of evaluation for further secondary stroke prevention measures. He will call office if interested in pursuing in the future    Doing well from stroke standpoint and risk factors are managed by PCP. She may follow up PRN, as usual for our patients who are strictly being followed for stroke. If any new neurological issues should arise, request PCP  place referral for evaluation by one of our neurologists. Thank you.     CC:  Berkley Harvey, NP    I spent 26 minutes of face-to-face and non-face-to-face time with patient.  This included previsit chart review, lab review, study review, electronic health record documentation, patient education regarding prior stroke including etiology, residual deficits, review of ILR, secondary stroke prevention measures and importance of managing stroke risk factors and answered all other questions to patients satisfaction  Frann Rider, AGNP-BC  [facility] [address] [place], [place] [address]  Phone [phone] Fax [phone] Note: This document was prepared with digital dictation and possible smart phrase technology. Any transcriptional errors that result from this process are unintentional.

## 2021-12-01 NOTE — Patient Instructions (Signed)
Continue aspirin 81 mg daily  and atorvastatin for secondary stroke prevention ° °Your loop recorder will continue to be monitored by cardiology - has not shown atrial fibrillation thus far  ° °Continue to follow up with PCP regarding cholesterol and blood pressure management  °Maintain strict control of hypertension with blood pressure goal below 130/90 and cholesterol with LDL cholesterol (bad cholesterol) goal below 70 mg/dL.  ° °Signs of a Stroke? Follow the BEFAST method:  °Balance Watch for a sudden loss of balance, trouble with coordination or vertigo °Eyes Is there a sudden loss of vision in one or both eyes? Or double vision?  °Face: Ask the person to smile. Does one side of the face droop or is it numb?  °Arms: Ask the person to raise both arms. Does one arm drift downward? Is there weakness or numbness of a leg? °Speech: Ask the person to repeat a simple phrase. Does the speech sound slurred/strange? Is the person confused ? °Time: If you observe any of these signs, call [phone]. ° ° ° ° ° ° °Thank you for coming to see us at [facility]. I hope we have been able to provide you high quality care today. ° °You may receive a patient satisfaction survey over the next few weeks. We would appreciate your feedback and comments so that we may continue to improve ourselves and the health of our patients. ° °

## 2021-12-17 ENCOUNTER — Ambulatory Visit (INDEPENDENT_AMBULATORY_CARE_PROVIDER_SITE_OTHER): Payer: Medicare Other

## 2021-12-17 DIAGNOSIS — I639 Cerebral infarction, unspecified: Secondary | ICD-10-CM

## 2021-12-18 LAB — CUP PACEART REMOTE DEVICE CHECK
Date Time Interrogation Session: 20230315231015
Implantable Pulse Generator Implant Date: 20210920

## 2021-12-29 NOTE — Progress Notes (Signed)
[facility] Summary Report / Loop Recorder 

## 2022-01-01 IMAGING — CT CT HEAD W/O CM
3 of 4 series · 13 of 47 positions shown, 15 images · non-contrast
Comparison: None

CLINICAL DATA: Altered mental status, neurological deficit,
suspected stroke

EXAM:
CT HEAD WITHOUT CONTRAST
TECHNIQUE: Contiguous axial images were obtained from the base of the skull
through the vertex without intravenous contrast. Sagittal and
coronal MPR images reconstructed from axial data set.

[Series 2: head wo · axial · 0.44mm/px · z∈[-134,-14]mm · 7 of 34 slices shown, 9 images]
[im 5/34  brain]
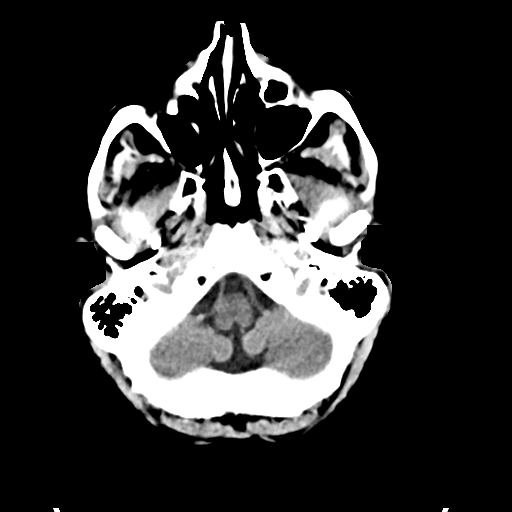
[im 5/34  bone]
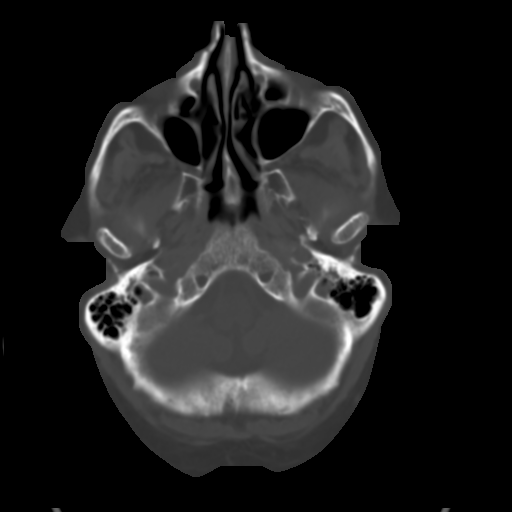
[im 9/34  brain]
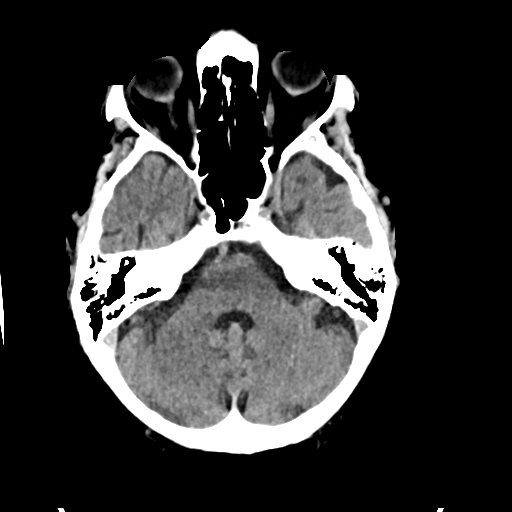
[im 13/34  brain]
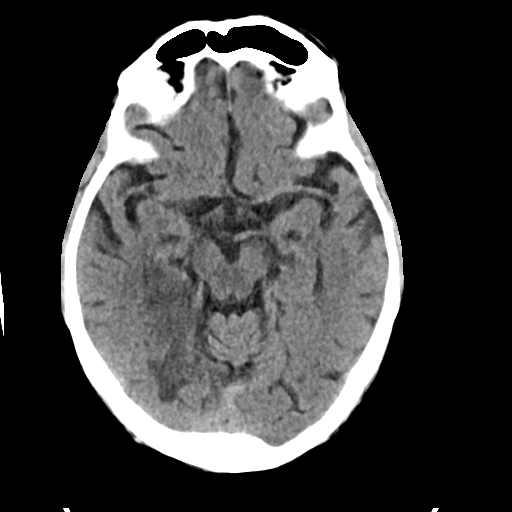
[im 17/34  brain]
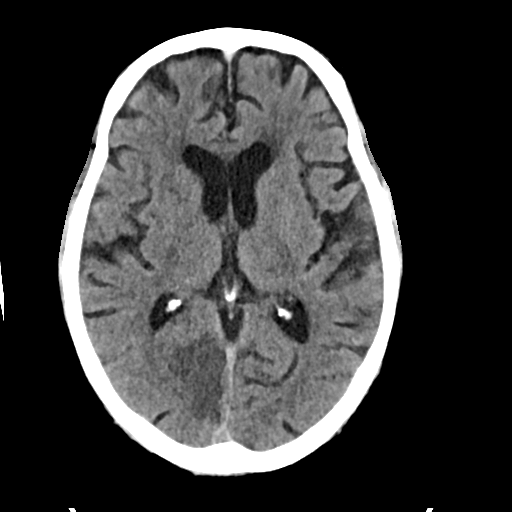
[im 21/34  brain]
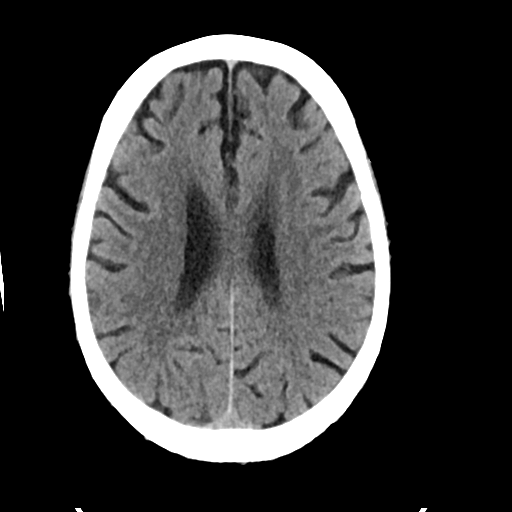
[im 21/34  bone]
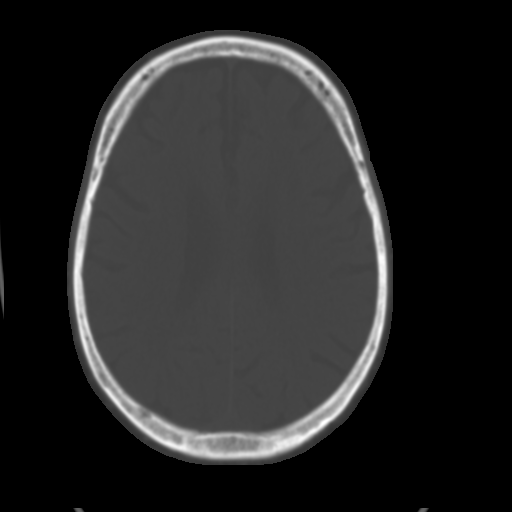
[im 25/34  brain]
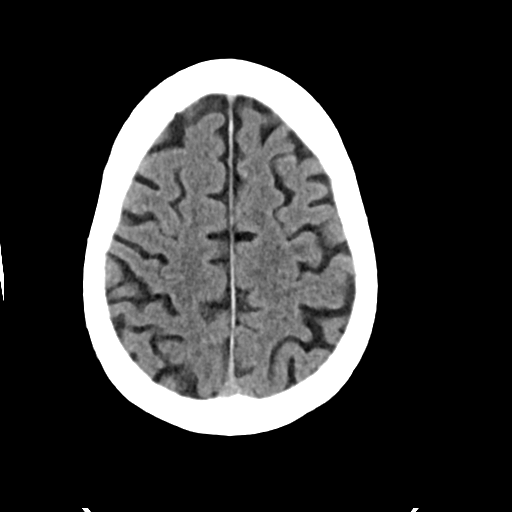
[im 29/34  brain]
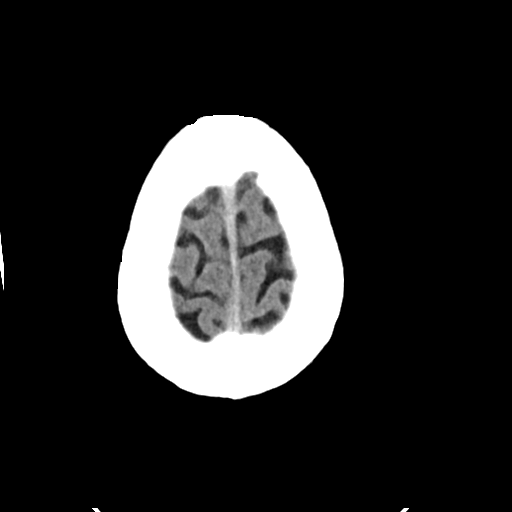

[Series 4: cor soft · coronal · 0.31mm/px · 3 of 90 slices shown]
[im 30/90  brain]
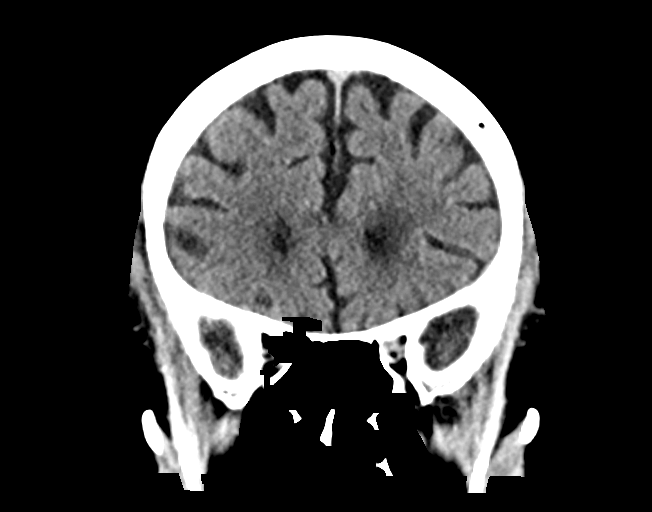
[im 40/90  brain]
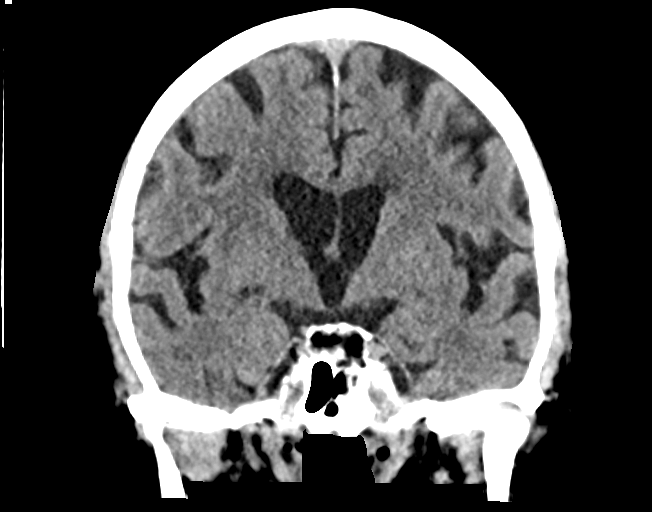
[im 50/90  brain]
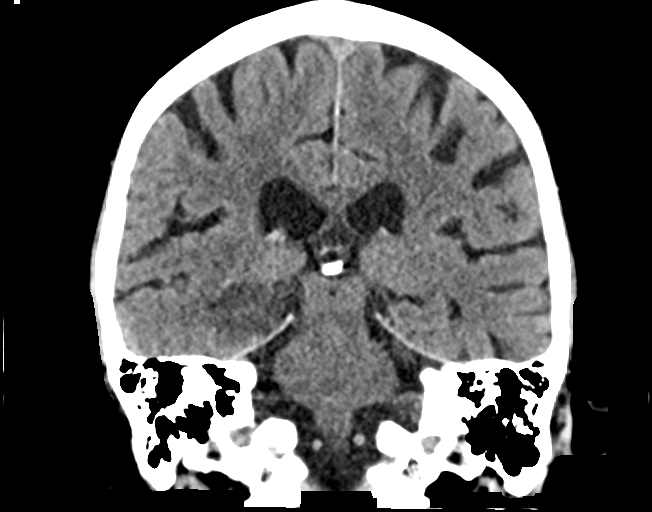

[Series 5: sag soft · sagittal · 0.31mm/px · 3 of 69 slices shown]
[im 23/69  brain]
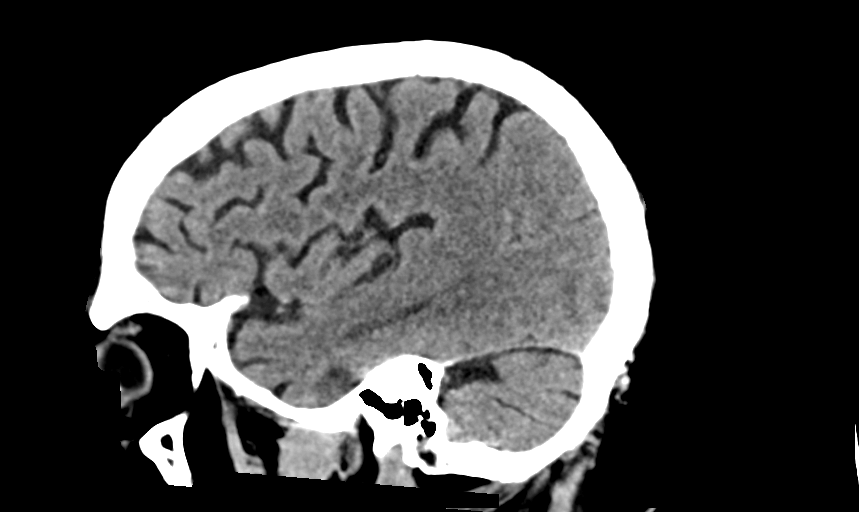
[im 35/69  brain]
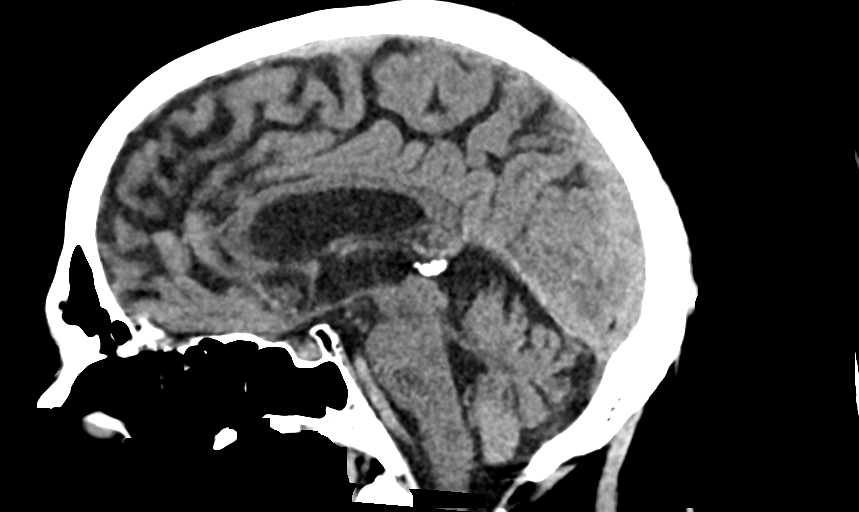
[im 46/69  brain]
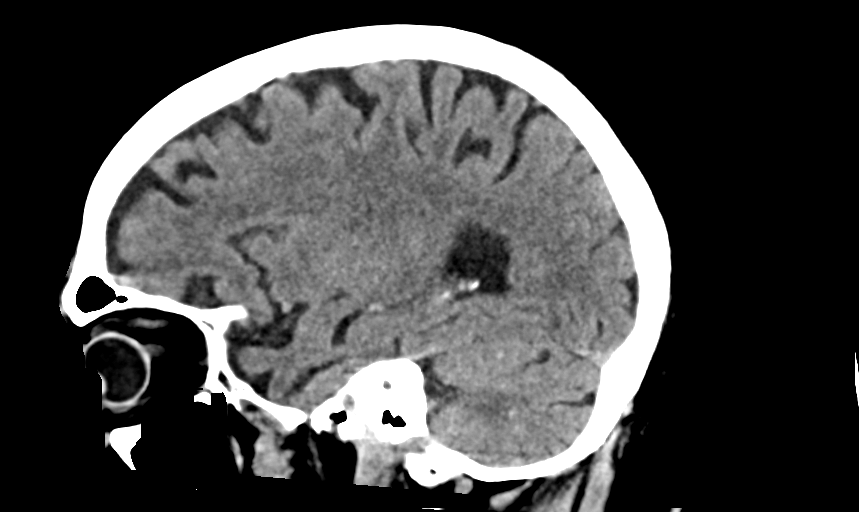

[13 of 47 positions shown; findings below may reference images not displayed]

FINDINGS: Brain: Generalized atrophy. Normal ventricular morphology. No
midline shift or mass effect. Small vessel chronic ischemic changes
of deep cerebral white matter. Subacute infarct identified at medial
aspect of posterior RIGHT temporal lobe extending into RIGHT
occipital lobe. No definite intracranial hemorrhage, mass lesion, or
extra-axial fluid collection.

Vascular: High attenuation thrombus identified within RIGHT
posterior cerebral artery branch extending to the infarcted segment.
Atherosclerotic calcification of internal carotid arteries at skull
base.

Skull: Intact

Sinuses/Orbits: Clear

Other: N/A
IMPRESSION: Atrophy with small vessel chronic ischemic changes of deep cerebral
white matter.

Subacute infarct at medial aspect of posterior RIGHT temporal lobe
extending into RIGHT occipital lobe.

High attenuation thrombus identified within RIGHT posterior cerebral
artery branch extending to the infarcted segment.

Findings called to Dr. Uzunali on 06/16/2020 at 8882 hours.

## 2022-01-01 IMAGING — MR MR HEAD W/O CM
6 of 11 series · 24 of 48 positions shown · non-contrast
Comparison: Noncontrast head CT performed earlier the same day
06/16/2020.

CLINICAL DATA: Neuro deficit, acute, stroke suspected.

EXAM:
MRI HEAD WITHOUT CONTRAST
TECHNIQUE: Multiplanar, multiecho pulse sequences of the brain and surrounding
structures were obtained without intravenous contrast.

[Series 2: DWI · axial · 3.0mm · 0.94mm/px · z∈[-62,+91]mm · 8 of 104 slices shown (1 of 2)]
[im 1/104]
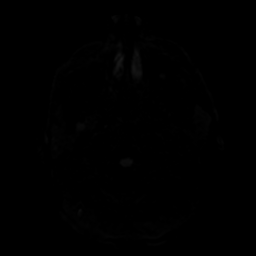
[im 15/104]
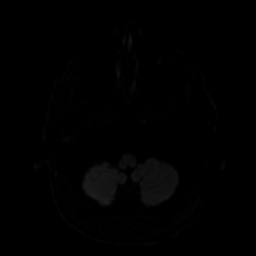
[im 30/104]
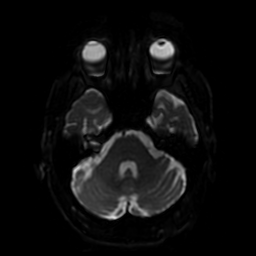
[im 45/104]
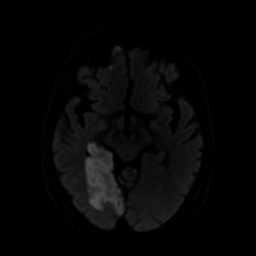
[im 59/104]
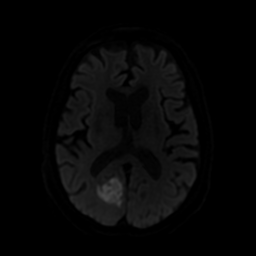
[im 74/104]
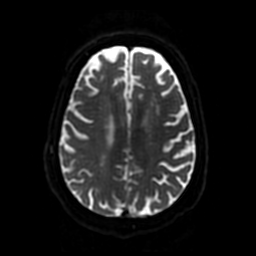
[im 89/104]
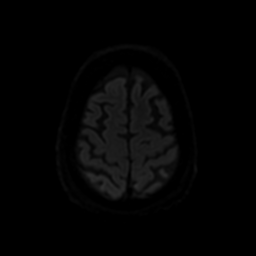
[im 104/104]
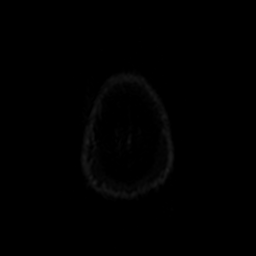

[Series 3: DWI · coronal · 4.0mm · 0.94mm/px · 5 of 74 slices shown (2 of 2)]
[im 1/74]
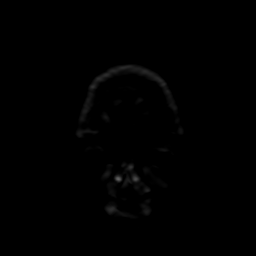
[im 19/74]
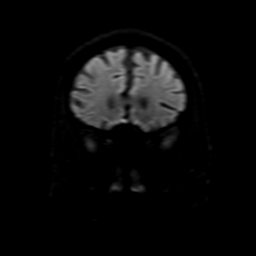
[im 37/74]
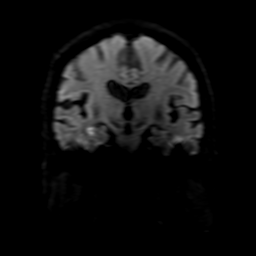
[im 55/74]
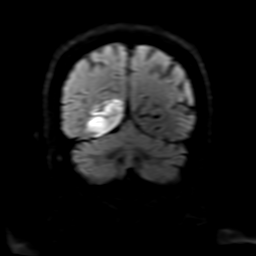
[im 74/74]
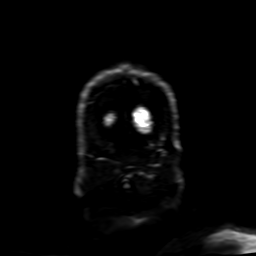

[Series 4: FLAIR · sagittal · 5.0mm · 0.23mm/px · 2 of 23 slices shown (1 of 2)]
[im 1/23]
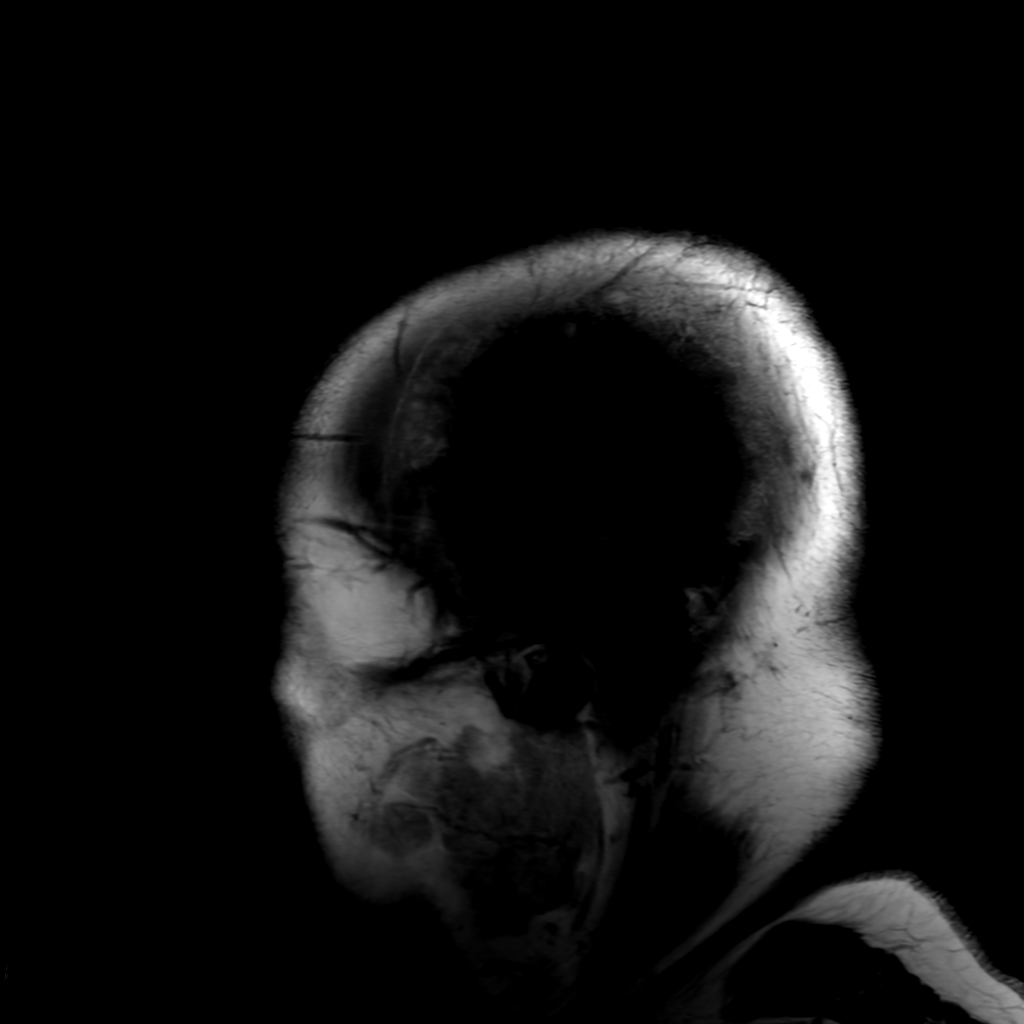
[im 23/23]
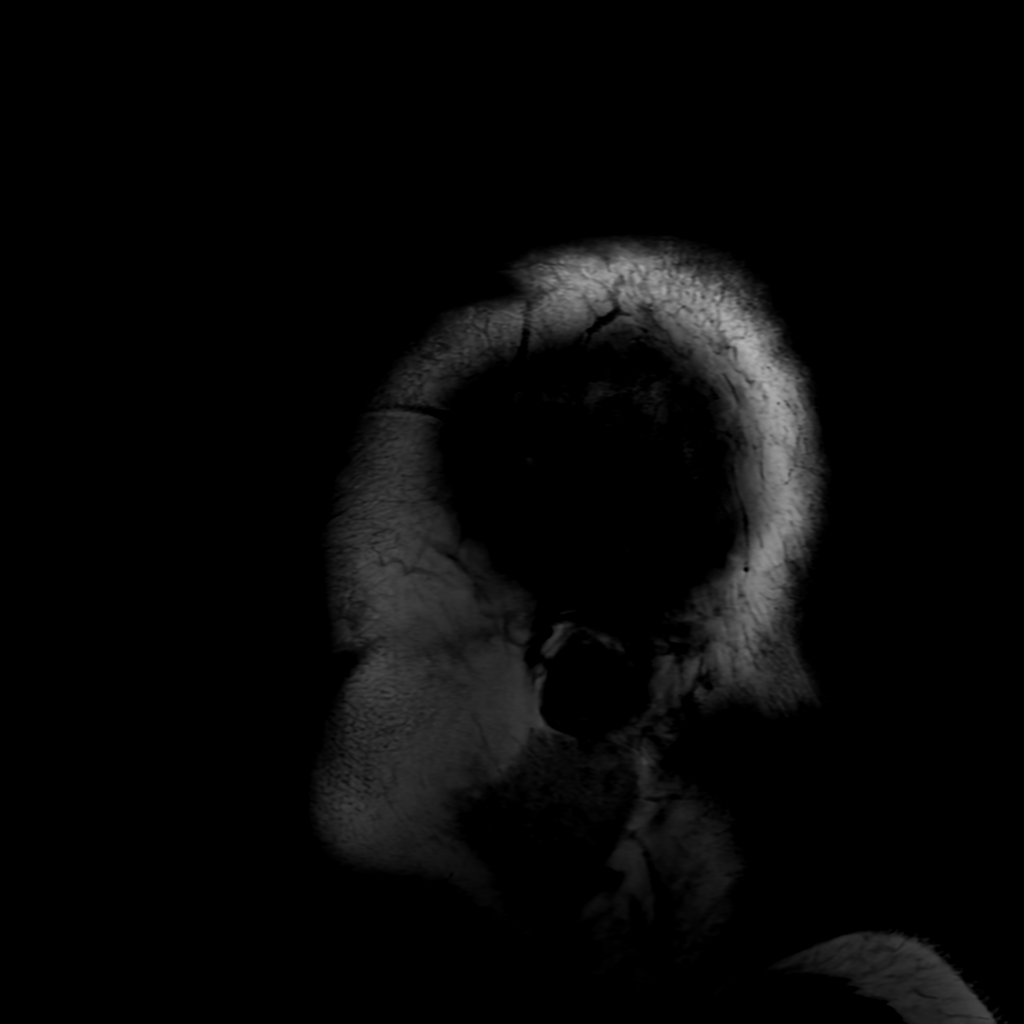

[Series 6: FLAIR · axial · 5.0mm · 0.47mm/px · z∈[-76,+92]mm · 2 of 29 slices shown (2 of 2)]
[im 1/29]
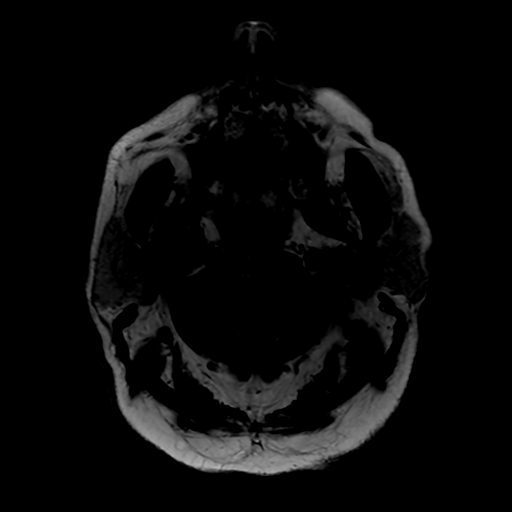
[im 29/29]
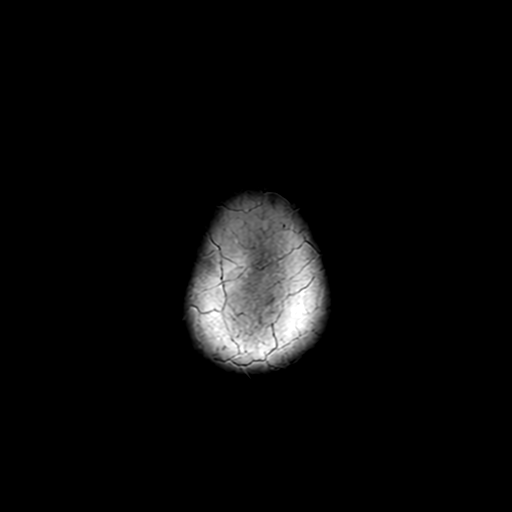

[Series 250: ADC · axial · 3.0mm · 0.94mm/px · z∈[-62,+91]mm · 4 of 52 slices shown (1 of 2)]
[im 1/52]
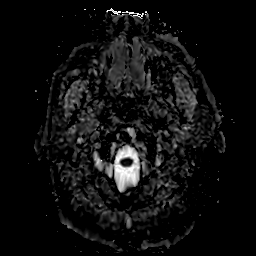
[im 18/52]
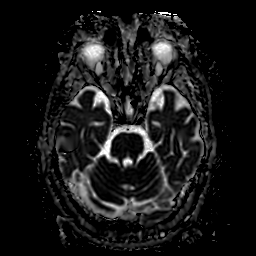
[im 35/52]
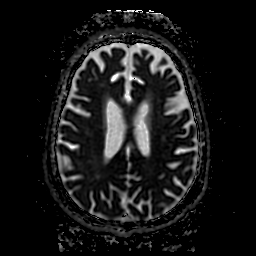
[im 52/52]
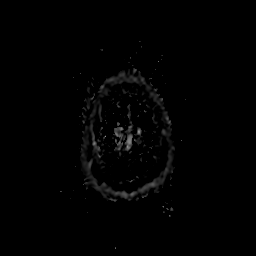

[Series 350: ADC · coronal · 4.0mm · 0.94mm/px · 3 of 37 slices shown (2 of 2)]
[im 1/37]
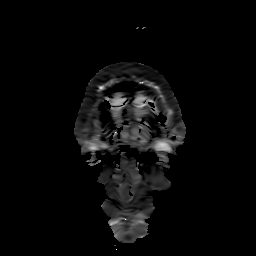
[im 19/37]
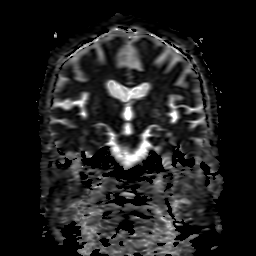
[im 37/37]
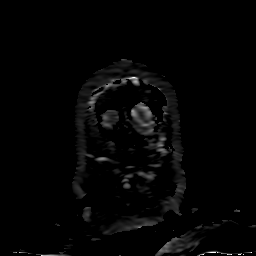

[24 of 48 positions shown; findings below may reference images not displayed]

FINDINGS: Brain:

Mild intermittent motion degradation.

Mild generalized parenchymal atrophy

There is a large acute/early subacute right PCA territory infarct
predominantly affecting the medial right temporal lobe and right
occipital lobe. Corresponding T2/FLAIR hyperintensity at this site.
No significant mass effect. Subtle SWI signal loss at the infarction
site likely reflecting petechial hemorrhage

Mild multifocal T2/FLAIR hyperintensity within the cerebral white
matter is nonspecific, but consistent with chronic small vessel
ischemic disease.

Prominent perivascular space versus small chronic lacunar infarct
within the right basal ganglia (series 6, image 15).

Probable tiny chronic infarcts within the bilateral cerebellar
hemispheres.

No evidence of intracranial mass.

No extra-axial fluid collection.

Vascular: The right posterior cerebral artery is likely occluded.
Otherwise, there are expected flow voids within the proximal large
arterial vessels

Skull and upper cervical spine: No focal marrow lesion

Sinuses/Orbits: Visualized orbits show no acute finding. Prior right
lens replacement. Minimal scattered paranasal sinus mucosal
thickening. No significant mastoid effusion
IMPRESSION: Large acute/early subacute right PCA territory infarction
predominantly affecting the medial right temporal lobe and right
occipital lobe. Mild associated petechial hemorrhage. No significant
mass effect.

Background mild generalized parenchymal atrophy and cerebral white
matter chronic small vessel ischemic disease.

Prominent perivascular space versus small chronic lacunar infarct
within the right basal ganglia.

Probable tiny chronic infarcts within the bilateral cerebellar
hemispheres.

## 2022-01-19 ENCOUNTER — Ambulatory Visit (INDEPENDENT_AMBULATORY_CARE_PROVIDER_SITE_OTHER): Payer: Medicare Other

## 2022-01-19 DIAGNOSIS — I639 Cerebral infarction, unspecified: Secondary | ICD-10-CM | POA: Diagnosis not present

## 2022-01-21 LAB — CUP PACEART REMOTE DEVICE CHECK
Date Time Interrogation Session: 20230416231620
Implantable Pulse Generator Implant Date: 20210920

## 2022-02-05 NOTE — Progress Notes (Signed)
[facility] Summary Report / Loop Recorder 

## 2022-02-24 ENCOUNTER — Ambulatory Visit (INDEPENDENT_AMBULATORY_CARE_PROVIDER_SITE_OTHER): Payer: Medicare Other

## 2022-02-24 ENCOUNTER — Encounter: Payer: Self-pay | Admitting: Internal Medicine

## 2022-02-24 ENCOUNTER — Ambulatory Visit: Payer: Medicare Other | Admitting: Internal Medicine

## 2022-02-24 VITALS — BP 150/72 | HR 58 | Ht 70.0 in | Wt 198.0 lb

## 2022-02-24 DIAGNOSIS — I639 Cerebral infarction, unspecified: Secondary | ICD-10-CM | POA: Diagnosis not present

## 2022-02-24 NOTE — Patient Instructions (Signed)
Medication Instructions:  Your physician recommends that you continue on your current medications as directed. Please refer to the Current Medication list given to you today.  Labwork: None ordered.  Testing/Procedures: None ordered.  Follow-Up: Your physician wants you to follow-up in: one year with Cristopher Peru, MD or one of the following Advanced Practice Providers on your designated Care Team:   Tommye Standard, [place] Legrand Como "Jonni Sanger" Chalmers Cater, [place]  Remote monthly monitoring is used to monitor your loop recorder.  Any Other Special Instructions Will Be Listed Below (If Applicable).  If you need a refill on your cardiac medications before your next appointment, please call your pharmacy.   Important Information About Sugar

## 2022-02-24 NOTE — Progress Notes (Signed)
HPI Mr. Yuhasz presents today for followup s/p cryptogenic stroke. He has mostly recovered with minimal left arm tingling. He had right PCA territory infarct. He denies palpitations or a h/o atrial fib. He denies chest pain or sob and is fairly active. He has been taking plavix with no bleeding. he is playing golf and occaisionally shoots his age. He walks 3 miles almost every day. No Known Allergies   Current Outpatient Medications  Medication Sig Dispense Refill   aspirin EC 81 MG tablet Take 1 tablet (81 mg total) by mouth daily. Swallow whole. 30 tablet 0   atorvastatin (LIPITOR) 80 MG tablet Take 1 tablet (80 mg total) by mouth daily. 90 tablet 0   diphenhydrAMINE (BENADRYL) 25 MG tablet Take 50 mg by mouth at bedtime as needed for sleep.     montelukast (SINGULAIR) 10 MG tablet Take 1 tablet by mouth at bedtime.  3   Multiple Vitamins-Minerals (MULTIVITAMIN WITH MINERALS) tablet Take 1 tablet by mouth daily.     olmesartan (BENICAR) 40 MG tablet Take 40 mg by mouth daily.     Omega-3 Fatty Acids (FISH OIL PO) Take 1 capsule by mouth daily.     predniSONE (DELTASONE) 5 MG tablet Take 5 mg by mouth daily with breakfast.     vitamin B-12 (CYANOCOBALAMIN) 1000 MCG tablet Take 1,000 mcg by mouth daily.     No current facility-administered medications for this visit.     Past Medical History:  Diagnosis Date   Arthritis    COPD (chronic obstructive pulmonary disease) ([place])    MINIMAL    History of kidney stones     ROS:   All systems reviewed and negative except as noted in the HPI.   Past Surgical History:  Procedure Laterality Date   CATARACT EXTRACTION W/ INTRAOCULAR LENS IMPLANT     RIGHT   CORNEAL TRANSPLANT     1996 LEFT EYE   TONSILLECTOMY     TOTAL SHOULDER ARTHROPLASTY Right 10/25/2014   Procedure: TOTAL SHOULDER ARTHROPLASTY;  Surgeon: Nita Sells, MD;  Location: [place];  Service: Orthopedics;  Laterality: Right;  Right total shoulder  arthroplasty     Family History  Problem Relation Age of Onset   Cancer Father    Stroke Father    Parkinson's disease Brother      Social History   Socioeconomic History   Marital status: Married    Spouse name: Langley Gauss   Number of children: Not on file   Years of education: Not on file   Highest education level: Bachelor's degree (e.g., BA, AB, BS)  Occupational History   Not on file  Tobacco Use   Smoking status: Former   Smokeless tobacco: Never   Tobacco comments:    quit 10-05-06  Substance and Sexual Activity   Alcohol use: Yes    Alcohol/week: 3.0 standard drinks    Types: 2 Glasses of wine, 1 Cans of beer per week    Comment: GLASS WINE OR DRINK DAILY   Drug use: No   Sexual activity: Not on file  Other Topics Concern   Not on file  Social History Narrative   Lives with wife   Caffeine- 4-5 c daily   Social Determinants of Health   Financial Resource Strain: Not on file  Food Insecurity: Not on file  Transportation Needs: Not on file  Physical Activity: Not on file  Stress: Not on file  Social Connections: Not on file  Intimate  Partner Violence: Not on file     BP (!) 150/72   Pulse (!) 58   Ht 5\' 10"  (1.778 m)   Wt 198 lb (89.8 kg)   SpO2 94%   BMI 28.41 kg/m   Physical Exam:  Well appearing NAD HEENT: Unremarkable Neck:  No JVD, no thyromegally Lymphatics:  No adenopathy Back:  No CVA tenderness Lungs:  Clear with no wheezes HEART:  Regular rate rhythm, no murmurs, no rubs, no clicks Abd:  soft, positive bowel sounds, no organomegally, no rebound, no guarding Ext:  2 plus pulses, no edema, no cyanosis, no clubbing Skin:  No rashes no nodules Neuro:  CN II through XII intact, motor grossly intact  EKG - nsr  DEVICE  Normal device function.  See PaceArt for details.   Assess/Plan:   1. Cryptogenic stroke - He is s/p ILR with no evidence of atrial fib. He will continue his current meds.  2. HTN - his bp is fairly well controlled  (? White coat HTN).  No change in his meds.   Carleene Overlie [place],MD

## 2022-02-25 LAB — CUP PACEART REMOTE DEVICE CHECK
Date Time Interrogation Session: 20230519230322
Implantable Pulse Generator Implant Date: 20210920

## 2022-03-11 NOTE — Progress Notes (Signed)
[facility] Summary Report / Loop Recorder 

## 2022-03-30 ENCOUNTER — Ambulatory Visit (INDEPENDENT_AMBULATORY_CARE_PROVIDER_SITE_OTHER): Payer: Medicare Other

## 2022-03-30 DIAGNOSIS — I639 Cerebral infarction, unspecified: Secondary | ICD-10-CM

## 2022-04-01 LAB — CUP PACEART REMOTE DEVICE CHECK
Date Time Interrogation Session: 20230621231206
Implantable Pulse Generator Implant Date: 20210920

## 2022-04-23 NOTE — Progress Notes (Signed)
[facility] Summary Report / Loop Recorder 

## 2022-05-04 ENCOUNTER — Ambulatory Visit (INDEPENDENT_AMBULATORY_CARE_PROVIDER_SITE_OTHER): Payer: Medicare Other

## 2022-05-04 DIAGNOSIS — I639 Cerebral infarction, unspecified: Secondary | ICD-10-CM

## 2022-05-04 LAB — CUP PACEART REMOTE DEVICE CHECK
Date Time Interrogation Session: 20230724230828
Implantable Pulse Generator Implant Date: 20210920

## 2022-06-05 NOTE — Progress Notes (Signed)
[facility] Summary Report / Loop Recorder 

## 2022-06-09 ENCOUNTER — Ambulatory Visit (INDEPENDENT_AMBULATORY_CARE_PROVIDER_SITE_OTHER): Payer: Medicare Other

## 2022-06-09 DIAGNOSIS — I639 Cerebral infarction, unspecified: Secondary | ICD-10-CM | POA: Diagnosis not present

## 2022-06-09 LAB — CUP PACEART REMOTE DEVICE CHECK
Date Time Interrogation Session: 20230905114008
Implantable Pulse Generator Implant Date: 20210920

## 2022-07-03 NOTE — Progress Notes (Signed)
[facility] Summary Report / Loop Recorder 

## 2022-07-05 LAB — CUP PACEART REMOTE DEVICE CHECK
Date Time Interrogation Session: 20230928230932
Implantable Pulse Generator Implant Date: 20210920

## 2022-07-10 ENCOUNTER — Ambulatory Visit (INDEPENDENT_AMBULATORY_CARE_PROVIDER_SITE_OTHER): Payer: Medicare Other

## 2022-07-10 DIAGNOSIS — I639 Cerebral infarction, unspecified: Secondary | ICD-10-CM

## 2022-07-14 NOTE — Progress Notes (Signed)
[facility] Summary Report / Loop Recorder 

## 2022-08-12 ENCOUNTER — Ambulatory Visit (INDEPENDENT_AMBULATORY_CARE_PROVIDER_SITE_OTHER): Payer: Medicare Other

## 2022-08-12 DIAGNOSIS — I639 Cerebral infarction, unspecified: Secondary | ICD-10-CM | POA: Diagnosis not present

## 2022-08-13 LAB — CUP PACEART REMOTE DEVICE CHECK
Date Time Interrogation Session: 20231107231052
Implantable Pulse Generator Implant Date: 20210920

## 2022-08-31 NOTE — Progress Notes (Signed)
[facility] Summary Report / Loop Recorder 

## 2022-09-14 ENCOUNTER — Ambulatory Visit (INDEPENDENT_AMBULATORY_CARE_PROVIDER_SITE_OTHER): Payer: Medicare Other

## 2022-09-14 DIAGNOSIS — I639 Cerebral infarction, unspecified: Secondary | ICD-10-CM

## 2022-09-15 LAB — CUP PACEART REMOTE DEVICE CHECK
Date Time Interrogation Session: 20231210231605
Implantable Pulse Generator Implant Date: 20210920

## 2022-10-19 ENCOUNTER — Ambulatory Visit (INDEPENDENT_AMBULATORY_CARE_PROVIDER_SITE_OTHER): Payer: Medicare Other

## 2022-10-19 DIAGNOSIS — I639 Cerebral infarction, unspecified: Secondary | ICD-10-CM | POA: Diagnosis not present

## 2022-10-20 LAB — CUP PACEART REMOTE DEVICE CHECK
Date Time Interrogation Session: 20240112231237
Implantable Pulse Generator Implant Date: 20210920

## 2022-10-23 NOTE — Progress Notes (Signed)
[facility] Summary Report / Loop Recorder

## 2022-11-22 LAB — CUP PACEART REMOTE DEVICE CHECK
Date Time Interrogation Session: 20240214231504
Implantable Pulse Generator Implant Date: 20210920

## 2022-11-23 ENCOUNTER — Ambulatory Visit (INDEPENDENT_AMBULATORY_CARE_PROVIDER_SITE_OTHER): Payer: Medicare Other

## 2022-11-23 DIAGNOSIS — I639 Cerebral infarction, unspecified: Secondary | ICD-10-CM

## 2022-12-09 NOTE — Progress Notes (Signed)
[facility] Summary Report / Loop Recorder 

## 2022-12-28 ENCOUNTER — Ambulatory Visit (INDEPENDENT_AMBULATORY_CARE_PROVIDER_SITE_OTHER): Payer: Medicare Other

## 2022-12-28 DIAGNOSIS — I639 Cerebral infarction, unspecified: Secondary | ICD-10-CM | POA: Diagnosis not present

## 2022-12-28 LAB — CUP PACEART REMOTE DEVICE CHECK
Date Time Interrogation Session: 20240324231552
Implantable Pulse Generator Implant Date: 20210920

## 2023-01-07 NOTE — Progress Notes (Signed)
[facility] Summary Report / Loop Recorder 

## 2023-01-29 ENCOUNTER — Ambulatory Visit (INDEPENDENT_AMBULATORY_CARE_PROVIDER_SITE_OTHER): Payer: Medicare Other

## 2023-01-29 DIAGNOSIS — I639 Cerebral infarction, unspecified: Secondary | ICD-10-CM

## 2023-02-01 LAB — CUP PACEART REMOTE DEVICE CHECK
Date Time Interrogation Session: 20240426230427
Implantable Pulse Generator Implant Date: 20210920

## 2023-02-09 NOTE — Progress Notes (Signed)
[facility] Summary Report / Loop Recorder 

## 2023-02-23 NOTE — Progress Notes (Signed)
[facility] Summary Report / Loop Recorder 

## 2023-03-03 ENCOUNTER — Ambulatory Visit (INDEPENDENT_AMBULATORY_CARE_PROVIDER_SITE_OTHER): Payer: Medicare Other

## 2023-03-03 DIAGNOSIS — I639 Cerebral infarction, unspecified: Secondary | ICD-10-CM | POA: Diagnosis not present

## 2023-03-04 LAB — CUP PACEART REMOTE DEVICE CHECK
Date Time Interrogation Session: 20240529230926
Implantable Pulse Generator Implant Date: 20210920

## 2023-03-25 NOTE — Progress Notes (Signed)
[facility] Summary Report / Loop Recorder 

## 2023-04-05 ENCOUNTER — Ambulatory Visit (INDEPENDENT_AMBULATORY_CARE_PROVIDER_SITE_OTHER): Payer: Medicare Other

## 2023-04-05 DIAGNOSIS — I639 Cerebral infarction, unspecified: Secondary | ICD-10-CM | POA: Diagnosis not present

## 2023-04-06 LAB — CUP PACEART REMOTE DEVICE CHECK
Date Time Interrogation Session: 20240701230738
Implantable Pulse Generator Implant Date: 20210920

## 2023-04-26 NOTE — Progress Notes (Signed)
[facility] Summary Report / Loop Recorder 

## 2023-05-09 LAB — CUP PACEART REMOTE DEVICE CHECK
Date Time Interrogation Session: 20240803230640
Implantable Pulse Generator Implant Date: 20210920

## 2023-05-10 ENCOUNTER — Ambulatory Visit: Payer: Medicare Other

## 2023-05-10 DIAGNOSIS — I639 Cerebral infarction, unspecified: Secondary | ICD-10-CM | POA: Diagnosis not present

## 2023-05-25 NOTE — Progress Notes (Signed)
[facility] Summary Report / Loop Recorder 

## 2023-06-14 ENCOUNTER — Ambulatory Visit: Payer: Medicare Other

## 2023-06-14 DIAGNOSIS — I639 Cerebral infarction, unspecified: Secondary | ICD-10-CM | POA: Diagnosis not present

## 2023-06-14 LAB — CUP PACEART REMOTE DEVICE CHECK
Date Time Interrogation Session: 20240905230612
Implantable Pulse Generator Implant Date: 20210920

## 2023-07-01 NOTE — Progress Notes (Signed)
[facility] Summary Report / Loop Recorder 

## 2023-07-19 ENCOUNTER — Ambulatory Visit (INDEPENDENT_AMBULATORY_CARE_PROVIDER_SITE_OTHER): Payer: Medicare Other

## 2023-07-19 DIAGNOSIS — I639 Cerebral infarction, unspecified: Secondary | ICD-10-CM | POA: Diagnosis not present

## 2023-07-19 LAB — CUP PACEART REMOTE DEVICE CHECK
Date Time Interrogation Session: 20241013231357
Implantable Pulse Generator Implant Date: 20210920

## 2023-08-05 NOTE — Progress Notes (Signed)
[facility] Summary Report / Loop Recorder 

## 2023-08-23 ENCOUNTER — Ambulatory Visit (INDEPENDENT_AMBULATORY_CARE_PROVIDER_SITE_OTHER): Payer: Medicare Other

## 2023-08-23 DIAGNOSIS — I639 Cerebral infarction, unspecified: Secondary | ICD-10-CM | POA: Diagnosis not present

## 2023-08-23 LAB — CUP PACEART REMOTE DEVICE CHECK
Date Time Interrogation Session: 20241117230321
Implantable Pulse Generator Implant Date: 20210920

## 2023-09-17 NOTE — Progress Notes (Signed)
[facility] Summary Report / Loop Recorder 

## 2023-09-27 ENCOUNTER — Ambulatory Visit (INDEPENDENT_AMBULATORY_CARE_PROVIDER_SITE_OTHER): Payer: Medicare Other

## 2023-09-27 DIAGNOSIS — I639 Cerebral infarction, unspecified: Secondary | ICD-10-CM | POA: Diagnosis not present

## 2023-09-28 LAB — CUP PACEART REMOTE DEVICE CHECK
Date Time Interrogation Session: 20241222230523
Implantable Pulse Generator Implant Date: 20210920

## 2023-11-01 ENCOUNTER — Ambulatory Visit (INDEPENDENT_AMBULATORY_CARE_PROVIDER_SITE_OTHER): Payer: Medicare Other

## 2023-11-01 DIAGNOSIS — I639 Cerebral infarction, unspecified: Secondary | ICD-10-CM

## 2023-11-01 LAB — CUP PACEART REMOTE DEVICE CHECK
Date Time Interrogation Session: 20250126230058
Implantable Pulse Generator Implant Date: 20210920

## 2023-11-08 NOTE — Progress Notes (Signed)
 [facility] Summary Report / Loop Recorder

## 2023-11-08 NOTE — Addendum Note (Signed)
Addended by: Geralyn Flash D on: 11/08/2023 10:51 AM   Modules accepted: Orders

## 2023-12-06 ENCOUNTER — Ambulatory Visit (INDEPENDENT_AMBULATORY_CARE_PROVIDER_SITE_OTHER): Payer: Medicare Other

## 2023-12-06 DIAGNOSIS — I639 Cerebral infarction, unspecified: Secondary | ICD-10-CM | POA: Diagnosis not present

## 2023-12-08 LAB — CUP PACEART REMOTE DEVICE CHECK
Date Time Interrogation Session: 20250302230248
Implantable Pulse Generator Implant Date: 20210920

## 2023-12-13 NOTE — Progress Notes (Signed)
 [facility] Summary Report / Loop Recorder

## 2024-01-10 ENCOUNTER — Ambulatory Visit (INDEPENDENT_AMBULATORY_CARE_PROVIDER_SITE_OTHER): Payer: Medicare Other

## 2024-01-10 DIAGNOSIS — I639 Cerebral infarction, unspecified: Secondary | ICD-10-CM

## 2024-01-10 NOTE — Addendum Note (Signed)
 Addended by: Geralyn Flash D on: 01/10/2024 11:30 AM   Modules accepted: Orders

## 2024-01-10 NOTE — Progress Notes (Signed)
 [facility] Summary Report / Loop Recorder

## 2024-01-11 LAB — CUP PACEART REMOTE DEVICE CHECK
Date Time Interrogation Session: 20250406230312
Implantable Pulse Generator Implant Date: 20210920

## 2024-01-12 ENCOUNTER — Encounter: Payer: Self-pay | Admitting: Internal Medicine

## 2024-02-14 ENCOUNTER — Ambulatory Visit (INDEPENDENT_AMBULATORY_CARE_PROVIDER_SITE_OTHER): Payer: Medicare Other

## 2024-02-14 DIAGNOSIS — I639 Cerebral infarction, unspecified: Secondary | ICD-10-CM

## 2024-02-15 ENCOUNTER — Ambulatory Visit: Payer: Self-pay | Admitting: Internal Medicine

## 2024-02-15 LAB — CUP PACEART REMOTE DEVICE CHECK
Date Time Interrogation Session: 20250511233226
Implantable Pulse Generator Implant Date: 20210920

## 2024-03-01 NOTE — Progress Notes (Signed)
 [facility] Summary Report / Loop Recorder

## 2024-03-01 NOTE — Addendum Note (Signed)
 Addended by: Edra Govern D on: 03/01/2024 12:46 PM   Modules accepted: Orders

## 2024-03-16 ENCOUNTER — Ambulatory Visit (INDEPENDENT_AMBULATORY_CARE_PROVIDER_SITE_OTHER)

## 2024-03-16 DIAGNOSIS — I639 Cerebral infarction, unspecified: Secondary | ICD-10-CM | POA: Diagnosis not present

## 2024-03-16 LAB — CUP PACEART REMOTE DEVICE CHECK
Date Time Interrogation Session: 20250611231047
Implantable Pulse Generator Implant Date: 20210920

## 2024-03-19 ENCOUNTER — Ambulatory Visit: Payer: Self-pay | Admitting: Internal Medicine

## 2024-03-20 ENCOUNTER — Encounter

## 2024-04-04 NOTE — Progress Notes (Signed)
 [facility] Summary Report / Loop Recorder

## 2024-04-04 NOTE — Addendum Note (Signed)
 Addended by: TAWNI DRILLING D on: 04/04/2024 12:38 PM   Modules accepted: Orders

## 2024-04-17 ENCOUNTER — Ambulatory Visit (INDEPENDENT_AMBULATORY_CARE_PROVIDER_SITE_OTHER)

## 2024-04-17 ENCOUNTER — Ambulatory Visit: Payer: Self-pay | Admitting: Internal Medicine

## 2024-04-17 DIAGNOSIS — I639 Cerebral infarction, unspecified: Secondary | ICD-10-CM

## 2024-04-17 LAB — CUP PACEART REMOTE DEVICE CHECK
Date Time Interrogation Session: 20250713231601
Implantable Pulse Generator Implant Date: 20210920

## 2024-04-20 ENCOUNTER — Encounter

## 2024-04-24 ENCOUNTER — Encounter

## 2024-05-10 NOTE — Progress Notes (Signed)
 [facility] Summary Report / Loop Recorder

## 2024-05-18 ENCOUNTER — Ambulatory Visit (INDEPENDENT_AMBULATORY_CARE_PROVIDER_SITE_OTHER)

## 2024-05-18 DIAGNOSIS — I639 Cerebral infarction, unspecified: Secondary | ICD-10-CM | POA: Diagnosis not present

## 2024-05-18 LAB — CUP PACEART REMOTE DEVICE CHECK
Date Time Interrogation Session: 20250813230906
Implantable Pulse Generator Implant Date: 20210920

## 2024-05-19 ENCOUNTER — Ambulatory Visit: Payer: Self-pay | Admitting: Internal Medicine

## 2024-05-22 ENCOUNTER — Encounter

## 2024-05-29 ENCOUNTER — Encounter

## 2024-06-06 ENCOUNTER — Telehealth: Payer: Self-pay

## 2024-06-06 NOTE — Telephone Encounter (Signed)
 Alert received from [employer] for 2 AF events, longest x 12 min.  Irregular R-R.  AF cannot be excluded.  Not on OAC per EMR.  Hx of CVA.  Reviewed with Dr. Inocencio and agrees AF/AFL noted. Recommends AF clinic referral.   Spoke to patient and wife in regards to AF noted and needs apt to discuss OAC d/t increased risk of CVA. Patient/wife agreeable to plan.

## 2024-06-14 NOTE — Progress Notes (Signed)
 [facility]  - Family Medicine Myra Master  Date of Service: 06/14/2024 Patient Name: Eric Brewer [facility] Patient DOB: 03/23/1941    Subjective:   Elbow Injury .   HPI Patient comes in for Elbow Injury   Work in today. 83 year old male with hx of DM, hx of COPD, CVA. Cognitive changes reports he hit his right elbow on door jam about 10 days ago, thought it would be better but it is not.  Still swelling. Pain is not as noted. Can't flex arm well. Still has swelling.  Doesn't feel hot or feverish.  Did use ice/ heat and rest to area.    Review of Systems Pertinent ROS items are noted in HPI.  Constitutional symptoms: negative Cardiovascular:  negative Respiratory:  negative Skin:  no open sores, no warmth, no drainage Neurological:  denies n/t or weakness Musculoskeletal:  right elbow having hard time with bending elbow swelling. Not a lot of pain Psychiatric:  hx of dementia Endocrine:  is on prednisone  5 mg daily for COPD Hematological:  no bleeding Allergic:  negative   The following portions of the patient's history were reviewed and updated as appropriate: allergies, current medications, PMH/PSH, past social history and problem list.   Past Medical/Surgical History:   Medical History[1] Surgical History[2]  Family History:   Family History[3]  Social History:   Social History[4] Tobacco Use History[5]   Allergies:   Patient has no known allergies.  Current Medications:   Current Medications[6]   Objective:   Vital Signs Temp 97.6 F (36.4 C)   Ht 1.778 m (5' 10)   Wt 91.2 kg (201 lb)   BMI 28.84 kg/m   BP Readings from Last 3 Encounters:  04/03/24 131/68  02/02/24 139/66  12/03/23 141/88   Wt Readings from Last 3 Encounters:  06/14/24 91.2 kg (201 lb)  04/03/24 87.5 kg (193 lb)  02/23/24 88.5 kg (195 lb)   No LMP for male patient.  Physical Exam  Constitutional.  Well appearing 83 y.o. male, well  developed, well nourished, no acute distress. Weight up 7 pounds.  Respiratory. respirations unlabored. Cardiovascular:  2+ peripheral pulses radial bil. Nails blanch less 4 sec Musculoskeletal: strength 2+, right elbow with noted edema, point tenderness medial olecranon area with no redness or warmth noted. Extension about 90%, flexion limited less 25%.  No noted deformities.  Neuro: alert, oriented x 3, CN 2-12 intact bil, no neuro deficits.  Skin: warm and dry, no open areas or drainage. Psych: cooperative, pleasant.   Assessment/Plan:    Eric Brewer was seen today for elbow injury.  Diagnoses and all orders for this visit:  Elbow injury, right, initial encounter -     Ambulatory referral to Orthopaedic Surgery; Future -     XR Elbow Minimum 3 Views Right; Future  Xray today. Ace wrap to area. Use ice prn.  Sch urgently with orthopedics for eval.  Call if worsening.  Can take tylenol  prn tid for pain if needed  Patient verbalizes understanding and in agreement with the above plan. All questions answered.    Medication side effects discussed with patient. Advised patient to call clinic or return for visit if these symptoms occur.   Goals of care discussed with patient including med compliance and adequate follow up.  Return for as scheduled.    This document serves as a record of services personally performed by Santana Molt, FN.  It was created on their behalf by Seldon  GORMAN Ann, CMA, a trained medical scribe, and Scientist, forensic (CMA). During the course of documenting the history, physical exam and medical decision making, I was functioning as a Stage manager. The creation of this record is the provider's dictation and/or activities during the visit.  Electronically signed by Seldon GORMAN Ann, CMA 06/14/2024 1:45 PM    This document was created using the aid of voice recognition Dragon dictation software.   Santana Tarry Molt, FNP       [1] Past  Medical History: Diagnosis Date  . Age-related nuclear cataract of both eyes   . Age-related nuclear cataract of left eye   . Allergy   . Arthritis   . Asthma (CMD)   . Basal cell carcinoma   . COPD (chronic obstructive pulmonary disease)    (CMD)   . 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 Cryptogenic stroke    (CMD)   . Diabetes mellitus    (CMD)   . Diverticulosis of colon   . Drusen of left macula   . Fuchs' endothelial dystrophy   . Hemianopia, homonymous, left   . Hyperlipidemia   . Hypermetropia of both eyes   . Hypertension   . Irregular astigmatism of left eye   . Macular degeneration   . Parotitis   . Pseudophakia, right eye   [2] Past Surgical History: Procedure Laterality Date  . CATARACT EXTRACTION W/  INTRAOCULAR LENS IMPLANT Right 09/2014   Procedure: CATARACT EXTRACTION W/  INTRAOCULAR LENS IMPLANT  . COLONOSCOPY     Procedure: COLONOSCOPY  . CORNEAL TRANSPLANT Left 1996   Procedure: CORNEAL TRANSPLANT  . JOINT REPLACEMENT     Procedure: JOINT REPLACEMENT  . SHOULDER SURGERY Right    Procedure: SHOULDER SURGERY; shoulder joint  [3] Family History Problem Relation Name Age of Onset  . Heart disease Mother    . Heart attack Mother    . Cataracts Mother    . Stroke Father    . Cancer Father    . Pancreatic cancer Father    . Hypertension Father    . Cataracts Father    . Parkinsonism Brother    . Diabetes Brother    . Cataracts Maternal Grandmother    . Cataracts Maternal Grandfather    . Cataracts Paternal Grandmother    . Cataracts Paternal Grandfather    . Glaucoma Neg Hx    . Macular degeneration Neg Hx    . Retinal detachment Neg Hx    [4] Social History Socioeconomic History  . Marital status: Married  Tobacco Use  . Smoking status: Former    Current packs/day: 0.00    Types: Cigarettes    Quit date: 10/05/2006    Years since quitting: 17.7  . Smokeless tobacco: Never  Vaping Use  . Vaping status: Never Used  Substance and Sexual  Activity  . Alcohol use: Yes    Comment: drinks 3 beers a day  . Drug use: No   Social Drivers of Health   Food Insecurity: Low Risk  (04/03/2024)   Food vital sign   . Within the past 12 months, you worried that your food would run out before you got money to buy more: Never true   . Within the past 12 months, the food you bought just didn't last and you didn't have money to get more: Never true  Transportation Needs: No Transportation Needs (04/03/2024)   Transportation   . In the past 12 months, has lack of reliable transportation  kept you from medical appointments, meetings, work or from getting things needed for daily living? : No  Safety: Low Risk  (04/03/2024)   Safety   . How often does anyone, including family and friends, physically hurt you?: Never   . How often does anyone, including family and friends, insult or talk down to you?: Never   . How often does anyone, including family and friends, threaten you with harm?: Never   . How often does anyone, including family and friends, scream or curse at you?: Never  Living Situation: Low Risk  (04/03/2024)   Living Situation   . What is your living situation today?: I have a steady place to live   . Think about the place you live. Do you have problems with any of the following? Choose all that apply:: None/None on this list  [5] Social History Tobacco Use  Smoking Status Former  . Current packs/day: 0.00  . Types: Cigarettes  . Quit date: 10/05/2006  . Years since quitting: 17.7  Smokeless Tobacco Never  [6] Current Outpatient Medications  Medication Sig Dispense Refill  . aspirin  81 mg EC tablet Take 81 mg by mouth.    . atorvastatin  (LIPITOR ) 80 mg tablet TAKE 1 TABLET BY MOUTH ONCE  DAILY 100 tablet 2  . cyanocobalamin (VITAMIN B12) 1,000 mcg tablet Take 1,000 mcg by mouth Once Daily.    . diphenhydrAMINE  (BENADRYL ) 25 mg capsule Take 25 mg by mouth every 6 (six) hours as needed for itching.    . montelukast  (SINGULAIR ) 10  mg tablet TAKE 1 TABLET BY MOUTH DAILY AS  DIRECTED 100 tablet 2  . multivitamin (THERAGRAN) tab tablet     . olmesartan (BENICAR) 40 mg tablet TAKE 1 TABLET BY MOUTH DAILY 100 tablet 2  . omega 3-dha-epa-fish oil (Fish OiL) 1,000 mg cap capsule Take  by mouth Once Daily.    . predniSONE  (DELTASONE ) 5 mg tablet TAKE 1 TABLET BY MOUTH DAILY 100 tablet 1   No current facility-administered medications for this visit.

## 2024-06-15 ENCOUNTER — Ambulatory Visit (HOSPITAL_COMMUNITY)
Admission: RE | Admit: 2024-06-15 | Discharge: 2024-06-15 | Disposition: A | Discharge status: Home / Self Care | Source: Ambulatory Visit | Attending: Internal Medicine | Admitting: Internal Medicine

## 2024-06-15 ENCOUNTER — Encounter (HOSPITAL_COMMUNITY): Payer: Self-pay | Admitting: Internal Medicine

## 2024-06-15 VITALS — BP 136/62 | HR 60 | Ht 70.0 in | Wt 201.2 lb

## 2024-06-15 DIAGNOSIS — I480 Paroxysmal atrial fibrillation: Secondary | ICD-10-CM | POA: Diagnosis not present

## 2024-06-15 DIAGNOSIS — D6869 Other thrombophilia: Secondary | ICD-10-CM

## 2024-06-15 DIAGNOSIS — I4891 Unspecified atrial fibrillation: Secondary | ICD-10-CM

## 2024-06-15 NOTE — Progress Notes (Signed)
 Primary Care Physician: Joshua Santana CROME, NP Primary Cardiologist: None Electrophysiologist: None     Referring Physician: devic clinic     Eric Brewer is a 83 y.o. male with a history of CVA and T2DM who presents for consultation in the [facility]rial [facility]ibr[facility]. ILR device alert on 06/06/24 for new Afib/flutter reviewed by Dr. Inocencio. Patient has a CHADS2VASC score of 4.  On evaluation today, patient is currently in NSR. Patient notes that day of episode was on 8/30 at the same time he injured his elbow. He is on ASA daily.    Today, he denies symptoms of palpitations, chest pain, shortness of breath, orthopnea, PND, lower extremity edema, dizziness, presyncope, syncope, snoring, daytime somnolence, bleeding, or neurologic sequela. The patient is tolerating medications without difficulties and is otherwise without complaint today.    he has a BMI of Body mass index is 28.87 kg/m.SABRA Filed Weights   06/15/24 0854  Weight: 91.3 kg    Current Outpatient Medications  Medication Sig Dispense Refill   aspirin  EC 81 MG tablet Take 1 tablet (81 mg total) by mouth daily. Swallow whole. 30 tablet 0   atorvastatin  (LIPITOR ) 80 MG tablet Take 1 tablet (80 mg total) by mouth daily. 90 tablet 0   diphenhydrAMINE  (BENADRYL ) 25 MG tablet Take 50 mg by mouth at bedtime as needed for sleep.     montelukast  (SINGULAIR ) 10 MG tablet Take 1 tablet by mouth at bedtime.  3   Multiple Vitamins-Minerals (MULTIVITAMIN WITH MINERALS) tablet Take 1 tablet by mouth daily.     olmesartan (BENICAR) 40 MG tablet Take 40 mg by mouth daily.     Omega-3 Fatty Acids (FISH OIL PO) Take 1 capsule by mouth daily.     predniSONE  (DELTASONE ) 5 MG tablet Take 5 mg by mouth daily with breakfast.     vitamin B-12 (CYANOCOBALAMIN) 1000 MCG tablet Take 1,000 mcg by mouth daily.     No current facility-administered medications for this encounter.    Atrial Fibrillation Management history:  Previous  antiarrhythmic drugs: none Previous cardioversions: none Previous ablations: none Anticoagulation history: none   ROS- All systems are reviewed and negative except as per the HPI above.  Physical Exam: BP 136/62   Pulse 60   Ht 5' 10 (1.778 m)   Wt 91.3 kg   BMI 28.87 kg/m   GEN: Well nourished, well developed in no acute distress NECK: No JVD; No carotid bruits CARDIAC: Regular rate and rhythm, no murmurs, rubs, gallops RESPIRATORY:  Clear to auscultation without rales, wheezing or rhonchi  ABDOMEN: Soft, non-tender, non-distended EXTREMITIES:  No edema; No deformity   EKG today demonstrates  Vent. rate 60 BPM PR interval 144 ms QRS duration 80 ms QT/QTcB 414/414 ms P-R-T axes 19 54 47 Normal sinus rhythm with sinus arrhythmia Nonspecific T wave abnormality Abnormal ECG When compared with ECG of 16-Jun-2020 11:06, No significant change was found  Echo 06/17/20 demonstrated   1. Left ventricular ejection fraction, by estimation, is 55 to 60%. The  left ventricle has normal function. The left ventricle has no regional  wall motion abnormalities. There is moderate left ventricular hypertrophy.  Left ventricular diastolic  parameters are consistent with Grade I diastolic dysfunction (impaired  relaxation).   2. Right ventricular systolic function is normal. The right ventricular  size is normal.   3. The mitral valve is abnormal. Trivial mitral valve regurgitation.   4. The aortic valve is tricuspid. Aortic valve regurgitation is  not  visualized. Mild to moderate aortic valve sclerosis/calcification is  present, without any evidence of aortic stenosis.   5. The inferior vena cava is normal in size with greater than 50%  respiratory variability, suggesting right atrial pressure of 3 mmHg.    ASSESSMENT & PLAN CHA2DS2-VASc Score = 4  The patient's score is based upon: CHF History: 0 HTN History: 0 Diabetes History: 0 Stroke History: 2 Vascular Disease History:  0 Age Score: 2 Gender Score: 0       ASSESSMENT AND PLAN: Paroxysmal Atrial Fibrillation (ICD10:  I48.0) The patient's CHA2DS2-VASc score is 4, indicating a 4.8% annual risk of stroke.    Patient is currently in NSR. Education provided about Afib. After discussion, we will proceed with conservative observation at this time via subsequent ILR checks.   Secondary Hypercoagulable State (ICD10:  D68.69) The patient is at significant risk for stroke/thromboembolism based upon his CHA2DS2-VASc Score of 4.   We discussed the reasoning behind anticoagulation in the setting of stroke prevention related to Afib. We discussed the benefits vs risks of anticoagulation. After discussion, patient would like to NOT begin anticoagulation and understands the potential risks of recurrent stroke.     Follow up Afib clinic prn.    Terra Pac, [facility]  Afib Clinic [address] [place], [place] [address] [phone]

## 2024-06-19 ENCOUNTER — Ambulatory Visit (INDEPENDENT_AMBULATORY_CARE_PROVIDER_SITE_OTHER)

## 2024-06-19 DIAGNOSIS — I639 Cerebral infarction, unspecified: Secondary | ICD-10-CM | POA: Diagnosis not present

## 2024-06-20 LAB — CUP PACEART REMOTE DEVICE CHECK
Date Time Interrogation Session: 20250913230422
Implantable Pulse Generator Implant Date: 20210920

## 2024-06-22 ENCOUNTER — Encounter

## 2024-06-23 ENCOUNTER — Ambulatory Visit: Payer: Self-pay | Admitting: Internal Medicine

## 2024-06-26 NOTE — Progress Notes (Signed)
 Remote Loop Recorder Transmission

## 2024-06-28 NOTE — Progress Notes (Signed)
 Remote Loop Recorder Transmission

## 2024-07-03 ENCOUNTER — Encounter

## 2024-07-03 NOTE — Telephone Encounter (Signed)
 Patient refused systemic anti-coag in afib clinic. I think not taking anti-coag is a mistake. GT

## 2024-07-13 NOTE — Progress Notes (Signed)
 Remote Loop Recorder Transmission

## 2024-07-20 ENCOUNTER — Encounter

## 2024-07-21 ENCOUNTER — Ambulatory Visit (INDEPENDENT_AMBULATORY_CARE_PROVIDER_SITE_OTHER)

## 2024-07-21 DIAGNOSIS — I639 Cerebral infarction, unspecified: Secondary | ICD-10-CM | POA: Diagnosis not present

## 2024-07-23 LAB — CUP PACEART REMOTE DEVICE CHECK
Date Time Interrogation Session: 20251016230434
Implantable Pulse Generator Implant Date: 20210920

## 2024-07-24 ENCOUNTER — Encounter

## 2024-07-25 NOTE — Progress Notes (Signed)
 Remote Loop Recorder Transmission

## 2024-07-26 ENCOUNTER — Ambulatory Visit: Payer: Self-pay | Admitting: Internal Medicine

## 2024-08-07 ENCOUNTER — Encounter

## 2024-08-21 ENCOUNTER — Encounter

## 2024-08-23 ENCOUNTER — Telehealth (HOSPITAL_BASED_OUTPATIENT_CLINIC_OR_DEPARTMENT_OTHER): Payer: Self-pay | Admitting: Nurse Practitioner

## 2024-08-23 NOTE — Telephone Encounter (Signed)
 Received call from patient's wife, who is a patient of mine, regarding concern that patient was advised of an episode of a fib and was scheduled to see PA in A Fib clinic. The wife felt the plan had already been decided when they arrived and did not feel that clear explanation or review of all records since ILR was implanted in 2021 were considered. They would like to see Dr. Waddell or EP APP Marshell, Trudy Arthur, or [facility]) for further advisement.

## 2024-08-24 ENCOUNTER — Ambulatory Visit: Attending: Internal Medicine

## 2024-08-24 ENCOUNTER — Ambulatory Visit: Payer: Self-pay | Admitting: Internal Medicine

## 2024-08-24 DIAGNOSIS — I639 Cerebral infarction, unspecified: Secondary | ICD-10-CM

## 2024-08-24 LAB — CUP PACEART REMOTE DEVICE CHECK
Date Time Interrogation Session: 20251119230426
Implantable Pulse Generator Implant Date: 20210920

## 2024-08-28 NOTE — Progress Notes (Signed)
 Remote Loop Recorder Transmission

## 2024-09-08 NOTE — Telephone Encounter (Signed)
 Thank you Cassie and Brandi!

## 2024-09-08 NOTE — Telephone Encounter (Signed)
 Spoke w/ patient & then wife as she is his driver. I apologized for the late call. Went over reasoning for my call - patient is scheduled to see Daphne Barrack, NP 12/19. Patients wife was very thankful and appreciative of call to get scheduled to go over a-fib episode and loop recorder transmission records.

## 2024-09-21 ENCOUNTER — Encounter

## 2024-09-21 NOTE — Progress Notes (Deleted)
°  Electrophysiology Office Note:   Date:  09/21/2024  ID:  BRAYDAN Brewer, DOB [birth year], MRN [id number]  Primary Cardiologist: None Primary Heart Failure: None Electrophysiologist: None  {Click to update primary MD,subspecialty MD or APP then REFRESH:1}    History of Present Illness:   Eric Brewer is a 83 y.o. male with h/o CVA s/p ILR with alert for new onset AF AFL 06/06/24 seen today for routine electrophysiology followup.   Pt was seen in AF Clinic on 06/15/24 and new onset AF was discussed after identification on ILR.  He elected to NOT start OAC at that time. His wife later called back and requested an additional appt (see phone note from 08/23/24).   Since last being seen in our clinic the patient reports doing ***.    He ***denies chest pain, palpitations, dyspnea, PND, orthopnea, nausea, vomiting, dizziness, syncope, edema, weight gain, or early satiety.   Review of systems complete and found to be negative unless listed in HPI.   EP Information / Studies Reviewed:    EKG is not ordered today. 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  If not, click here to update score.  REFRESH note.  :1} This indicates a 4.8% annual risk of stroke. The patient's score is based upon: CHF History: 0 HTN History: 0 Diabetes History: 0 Stroke History: 2 Vascular Disease History: 0 Age Score: 2 Gender Score: 0   {This patient has a significant risk of stroke if diagnosed with atrial fibrillation.  Please consider VKA or DOAC agent for anticoagulation if the bleeding risk is acceptable.   You can also use the SmartPhrase .[email or web address] for documentation.   :[id number]} No BP recorded.  {Refresh Note OR Click here to enter BP  :1}***         Physical Exam:   VS:  There were no vitals taken for this visit.   Wt Readings from Last 3 Encounters:  06/15/24 201 lb 3.2 oz (91.3 kg)  02/24/22 198 lb (89.8 kg)  12/01/21 202 lb (91.6 kg)     GEN: Well nourished, well developed in no acute distress NECK: No JVD; No carotid bruits CARDIAC: {EPRHYTHM:28826}, no murmurs, rubs, gallops RESPIRATORY:  Clear to auscultation without rales, wheezing or rhonchi  ABDOMEN: Soft, non-tender, non-distended EXTREMITIES:  No edema; No deformity   ASSESSMENT AND PLAN:    Paroxysmal Atrial Fibrillation  New identification on ILR 06/2024  -being OAC ? ***  -ILR review shows ***    Follow up with {EPMDS:28135::EP Team} {EPFOLLOW UP:28173} > transition to new EP MD  Signed, Daphne Barrack, NP-C, AGACNP-BC [facility] - Electrophysiology  09/21/2024, 8:06 AM

## 2024-09-22 ENCOUNTER — Ambulatory Visit: Admitting: Pulmonary Disease

## 2024-09-24 ENCOUNTER — Ambulatory Visit

## 2024-09-24 DIAGNOSIS — I639 Cerebral infarction, unspecified: Secondary | ICD-10-CM

## 2024-09-26 LAB — CUP PACEART REMOTE DEVICE CHECK
Date Time Interrogation Session: 20251220230217
Implantable Pulse Generator Implant Date: 20210920

## 2024-09-27 NOTE — Progress Notes (Signed)
 Remote Loop Recorder Transmission

## 2024-10-01 ENCOUNTER — Ambulatory Visit: Payer: Self-pay | Admitting: Internal Medicine

## 2024-10-22 ENCOUNTER — Encounter

## 2024-10-25 ENCOUNTER — Ambulatory Visit

## 2024-10-25 DIAGNOSIS — I639 Cerebral infarction, unspecified: Secondary | ICD-10-CM | POA: Diagnosis not present

## 2024-10-25 LAB — CUP PACEART REMOTE DEVICE CHECK
Date Time Interrogation Session: 20260120230304
Implantable Pulse Generator Implant Date: 20210920

## 2024-10-28 NOTE — Progress Notes (Signed)
 Remote Loop Recorder Transmission

## 2024-10-31 ENCOUNTER — Ambulatory Visit: Payer: Self-pay | Admitting: Cardiovascular Disease

## 2024-11-08 NOTE — Progress Notes (Unsigned)
 " Electrophysiology Office Note:   Date:  11/09/2024  ID:  Eric Brewer, DOB 1941/06/26, MRN [id number]  Primary Cardiologist: None Primary Heart Failure: None Electrophysiologist: None      History of Present Illness:   Eric Brewer is a 84 y.o. male with h/o CVA s/p ILR, DM II seen today for routine electrophysiology followup.   He was seen in the AF Clinic 06/15/24 after ILR alerted of AF - episodes occurred on 06/03/24 lasting 12 minutes and 6 minutes. Anticoagulation was discussed with patient and he elected not to start at that time.   Since last being seen in our clinic the patient reports doing well. He and his wife state they do not understand why he needs to be on a blood thinner. They relay that their visit the last time felt like they were in a commercial and the plan had been decided. They have a family member who died on Eliquis.  He states he does not think he has AF. He thinks it is because he had an episode due to hitting his elbow.   He denies chest pain, palpitations, dyspnea, PND, orthopnea, nausea, vomiting, dizziness, syncope, edema, weight gain, or early satiety.   Review of systems complete and found to be negative unless listed in HPI.    EP Information / Studies Reviewed:    EKG is not ordered today. EKG from 06/15/24 reviewed which showed SR 60 bpm      Device MDT LINQ II implanted 06/24/20 for CVA   Physical Exam:   VS:  BP 138/78   Pulse 99   Ht 5' 10 (1.778 m)   Wt 210 lb (95.3 kg)   SpO2 95%   BMI 30.13 kg/m    Wt Readings from Last 3 Encounters:  11/09/24 210 lb (95.3 kg)  06/15/24 201 lb 3.2 oz (91.3 kg)  02/24/22 198 lb (89.8 kg)     GEN: Well nourished, well developed in no acute distress NECK: No JVD; No carotid bruits CARDIAC: Regular rate and rhythm, no murmurs, rubs, gallops RESPIRATORY:  Clear to auscultation without rales, wheezing or rhonchi  ABDOMEN: Soft, non-tender, non-distended EXTREMITIES:  No edema; No deformity   Risk  Assessment/Calculations:    CHA2DS2-VASc Score = 4   This indicates a 4.8% annual risk of stroke. The patient's score is based upon: CHF History: 0 HTN History: 0 Diabetes History: 0 Stroke History: 2 Vascular Disease History: 0 Age Score: 2 Gender Score: 0      ASSESSMENT AND PLAN:    Paroxysmal Atrial Fibrillation  CHA2DS2-VASc 4 -brief episodes of AF on 8/30 -pt previously declined OAC    -shared discussion > extensive discussion and teaching regarding stroke risk score reviewed with patient & wife & risk of bleeding on OAC.  Discussed bleeding events are rare and medication can be reversed / bleeding treated. However, stroke can not be reversed. We reviewed indications for [facility] given prior stroke and documented AF. I pulled up the images of his rhythms from his ILR to show them the difference between AF and SR.  He indicates he does not believe he needs to be on a blood thinner and he is ok with the idea of stroke ending his life. I offered to call their children to discuss further and they declined.  His wife asks about a Watchman device. Information provided and patient informed he would have to take a blood thinner in the short term if he elected to pursue.   I  confirmed at the end of our discussion that he does not want to take OAC and he indicates he does not.   Hx CVA -on ASA 81 mg    Follow up with PCP as needed. No specific EP follow up at this time.   Signed, Daphne Barrack, NP-C, AGACNP-BC [facility] - Electrophysiology  11/09/2024, 9:38 AM   "

## 2024-11-09 ENCOUNTER — Encounter: Payer: Self-pay | Admitting: Pulmonary Disease

## 2024-11-09 ENCOUNTER — Ambulatory Visit: Admitting: Pulmonary Disease

## 2024-11-09 VITALS — BP 138/78 | HR 99 | Ht 70.0 in | Wt 210.0 lb

## 2024-11-09 DIAGNOSIS — I639 Cerebral infarction, unspecified: Secondary | ICD-10-CM

## 2024-11-09 DIAGNOSIS — I4891 Unspecified atrial fibrillation: Secondary | ICD-10-CM

## 2024-11-09 NOTE — Patient Instructions (Addendum)
 Medication Instructions:  Your physician recommends that you continue on your current medications as directed. Please refer to the Current Medication list given to you today.  *If you need a refill on your cardiac medications before your next appointment, please call your pharmacy*  Lab Work: None ordered If you have labs (blood work) drawn today and your tests are completely normal, you will receive your results only by: MyChart Message (if you have MyChart) OR A paper copy in the mail If you have any lab test that is abnormal or we need to change your treatment, we will call you to review the results.  Follow-Up: At [facility], you and your health needs are our priority.  As part of our continuing mission to provide you with exceptional heart care, our providers are all part of one team.  This team includes your primary Cardiologist (physician) and Advanced Practice Providers or APPs (Physician Assistants and Nurse Practitioners) who all work together to provide you with the care you need, when you need it.  Your next appointment:   As needed, let us  know if you'd like to proceed with Watchman as discussed at today's visit

## 2024-11-25 ENCOUNTER — Ambulatory Visit

## 2024-12-26 ENCOUNTER — Ambulatory Visit

## 2025-01-26 ENCOUNTER — Ambulatory Visit

## 2025-02-26 ENCOUNTER — Ambulatory Visit

## 2025-03-29 ENCOUNTER — Ambulatory Visit

## 2025-04-29 ENCOUNTER — Ambulatory Visit

## 2025-05-30 ENCOUNTER — Ambulatory Visit

## 2025-06-30 ENCOUNTER — Ambulatory Visit

## 2025-07-31 ENCOUNTER — Ambulatory Visit

## 2025-08-31 ENCOUNTER — Ambulatory Visit

## 2025-10-01 ENCOUNTER — Ambulatory Visit
